# Patient Record
Sex: Female | Born: 1970 | Race: White | Hispanic: No | Marital: Married | Smoking: Never smoker
Health system: Southern US, Community
[De-identification: ages and names within clinical notes are randomized; demographics above are authoritative.]

## PROBLEM LIST (undated history)

## (undated) DIAGNOSIS — G43909 Migraine, unspecified, not intractable, without status migrainosus: Secondary | ICD-10-CM

## (undated) DIAGNOSIS — J45909 Unspecified asthma, uncomplicated: Secondary | ICD-10-CM

## (undated) DIAGNOSIS — N6001 Solitary cyst of right breast: Secondary | ICD-10-CM

## (undated) DIAGNOSIS — T7840XA Allergy, unspecified, initial encounter: Secondary | ICD-10-CM

## (undated) HISTORY — DX: Migraine, unspecified, not intractable, without status migrainosus: G43.909

## (undated) HISTORY — DX: Allergy, unspecified, initial encounter: T78.40XA

## (undated) HISTORY — DX: Unspecified asthma, uncomplicated: J45.909

## (undated) HISTORY — DX: Solitary cyst of right breast: N60.01

---

## 2014-05-31 ENCOUNTER — Ambulatory Visit (INDEPENDENT_AMBULATORY_CARE_PROVIDER_SITE_OTHER): Payer: BC Managed Care – PPO | Admitting: Family Medicine

## 2014-05-31 VITALS — BP 110/68 | HR 70 | Temp 97.6°F | Resp 18 | Ht 63.5 in | Wt 121.0 lb

## 2014-05-31 DIAGNOSIS — J3089 Other allergic rhinitis: Secondary | ICD-10-CM

## 2014-05-31 DIAGNOSIS — J302 Other seasonal allergic rhinitis: Secondary | ICD-10-CM

## 2014-05-31 DIAGNOSIS — J019 Acute sinusitis, unspecified: Secondary | ICD-10-CM

## 2014-05-31 DIAGNOSIS — J0181 Other acute recurrent sinusitis: Secondary | ICD-10-CM

## 2014-05-31 DIAGNOSIS — J45909 Unspecified asthma, uncomplicated: Secondary | ICD-10-CM

## 2014-05-31 MED ORDER — PREDNISONE 20 MG PO TABS: 40.0000 mg | ORAL_TABLET | Freq: Every day | ORAL | Status: DC

## 2014-05-31 MED ORDER — AMOXICILLIN-POT CLAVULANATE 875-125 MG PO TABS: 1.0000 | ORAL_TABLET | Freq: Two times a day (BID) | ORAL | Status: DC

## 2014-05-31 MED ORDER — FLUTICASONE-SALMETEROL 250-50 MCG/DOSE IN AEPB: 1.0000 | INHALATION_SPRAY | Freq: Two times a day (BID) | RESPIRATORY_TRACT | Status: AC

## 2014-05-31 NOTE — Patient Instructions (Signed)
Start prednisone, continue flonase, zyrtec. If pain/sinus symptoms not improving in next 2-3 days - can fill Augmentin. Advair prescribed, and I will refer you to allergist.  Return to the clinic or go to the nearest emergency room if any of your symptoms worsen or new symptoms occur.     Sinusitis Sinusitis is redness, soreness, and inflammation of the paranasal sinuses. Paranasal sinuses are air pockets within the bones of your face (beneath the eyes, the middle of the forehead, or above the eyes). In healthy paranasal sinuses, mucus is able to drain out, and air is able to circulate through them by way of your nose. However, when your paranasal sinuses are inflamed, mucus and air can become trapped. This can allow bacteria and other germs to grow and cause infection. Sinusitis can develop quickly and last only a short time (acute) or continue over a long period (chronic). Sinusitis that lasts for more than 12 weeks is considered chronic.  CAUSES  Causes of sinusitis include:  Allergies.  Structural abnormalities, such as displacement of the cartilage that separates your nostrils (deviated septum), which can decrease the air flow through your nose and sinuses and affect sinus drainage.  Functional abnormalities, such as when the small hairs (cilia) that line your sinuses and help remove mucus do not work properly or are not present. SIGNS AND SYMPTOMS  Symptoms of acute and chronic sinusitis are the same. The primary symptoms are pain and pressure around the affected sinuses. Other symptoms include:  Upper toothache.  Earache.  Headache.  Bad breath.  Decreased sense of smell and taste.  A cough, which worsens when you are lying flat.  Fatigue.  Fever.  Thick drainage from your nose, which often is green and may contain pus (purulent).  Swelling and warmth over the affected sinuses. DIAGNOSIS  Your health care provider will perform a physical exam. During the exam, your  health care provider may:  Look in your nose for signs of abnormal growths in your nostrils (nasal polyps).  Tap over the affected sinus to check for signs of infection.  View the inside of your sinuses (endoscopy) using an imaging device that has a light attached (endoscope). If your health care provider suspects that you have chronic sinusitis, one or more of the following tests may be recommended:  Allergy tests.  Nasal culture. A sample of mucus is taken from your nose, sent to a lab, and screened for bacteria.  Nasal cytology. A sample of mucus is taken from your nose and examined by your health care provider to determine if your sinusitis is related to an allergy. TREATMENT  Most cases of acute sinusitis are related to a viral infection and will resolve on their own within 10 days. Sometimes medicines are prescribed to help relieve symptoms (pain medicine, decongestants, nasal steroid sprays, or saline sprays).  However, for sinusitis related to a bacterial infection, your health care provider will prescribe antibiotic medicines. These are medicines that will help kill the bacteria causing the infection.  Rarely, sinusitis is caused by a fungal infection. In theses cases, your health care provider will prescribe antifungal medicine. For some cases of chronic sinusitis, surgery is needed. Generally, these are cases in which sinusitis recurs more than 3 times per year, despite other treatments. HOME CARE INSTRUCTIONS   Drink plenty of water. Water helps thin the mucus so your sinuses can drain more easily.  Use a humidifier.  Inhale steam 3 to 4 times a day (for example, sit in  the bathroom with the shower running).  Apply a warm, moist washcloth to your face 3 to 4 times a day, or as directed by your health care provider.  Use saline nasal sprays to help moisten and clean your sinuses.  Take medicines only as directed by your health care provider.  If you were prescribed either  an antibiotic or antifungal medicine, finish it all even if you start to feel better. SEEK IMMEDIATE MEDICAL CARE IF:  You have increasing pain or severe headaches.  You have nausea, vomiting, or drowsiness.  You have swelling around your face.  You have vision problems.  You have a stiff neck.  You have difficulty breathing. MAKE SURE YOU:   Understand these instructions.  Will watch your condition.  Will get help right away if you are not doing well or get worse. Document Released: 09/03/2005 Document Revised: 01/18/2014 Document Reviewed: 09/18/2011 [facility] Patient Information 2015 [place], [place]. This information is not intended to replace advice given to you by your health care provider. Make sure you discuss any questions you have with your health care provider. Allergic Rhinitis Allergic rhinitis is when the mucous membranes in the nose respond to allergens. Allergens are particles in the air that cause your body to have an allergic reaction. This causes you to release allergic antibodies. Through a chain of events, these eventually cause you to release histamine into the blood stream. Although meant to protect the body, it is this release of histamine that causes your discomfort, such as frequent sneezing, congestion, and an itchy, runny nose.  CAUSES  Seasonal allergic rhinitis (hay fever) is caused by pollen allergens that may come from grasses, trees, and weeds. Year-round allergic rhinitis (perennial allergic rhinitis) is caused by allergens such as house dust mites, pet dander, and mold spores.  SYMPTOMS   Nasal stuffiness (congestion).  Itchy, runny nose with sneezing and tearing of the eyes. DIAGNOSIS  Your health care provider can help you determine the allergen or allergens that trigger your symptoms. If you and your health care provider are unable to determine the allergen, skin or blood testing may be used. TREATMENT  Allergic rhinitis does not have a cure, but  it can be controlled by:  Medicines and allergy shots (immunotherapy).  Avoiding the allergen. Hay fever may often be treated with antihistamines in pill or nasal spray forms. Antihistamines block the effects of histamine. There are over-the-counter medicines that may help with nasal congestion and swelling around the eyes. Check with your health care provider before taking or giving this medicine.  If avoiding the allergen or the medicine prescribed do not work, there are many new medicines your health care provider can prescribe. Stronger medicine may be used if initial measures are ineffective. Desensitizing injections can be used if medicine and avoidance does not work. Desensitization is when a patient is given ongoing shots until the body becomes less sensitive to the allergen. Make sure you follow up with your health care provider if problems continue. HOME CARE INSTRUCTIONS It is not possible to completely avoid allergens, but you can reduce your symptoms by taking steps to limit your exposure to them. It helps to know exactly what you are allergic to so that you can avoid your specific triggers. SEEK MEDICAL CARE IF:   You have a fever.  You develop a cough that does not stop easily (persistent).  You have shortness of breath.  You start wheezing.  Symptoms interfere with normal daily activities. Document Released: 05/29/2001 Document  Revised: 09/08/2013 Document Reviewed: 05/11/2013 [facility] Patient Information 2015 [place], [place]. This information is not intended to replace advice given to you by your health care provider. Make sure you discuss any questions you have with your health care provider.

## 2014-05-31 NOTE — Progress Notes (Signed)
Subjective:    Patient ID: Jocelyn Hayes, female    DOB: 06-19-1971, 43 y.o.   MRN: [id number]  HPI Jocelyn Hayes is a 43 y.o. female  Moved from [place], [place]. 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   Hx of seasonal allergies - usually in August. Takes flonase most days, has been taking every day for past 3-4 days and Zyrtec, hx allergies and exercise induced asthma. Prior on Advair - higher dose during allergy season. Insurance substituted Symbicort - unknown dose, but with new insurance would like to restart Advair. Has albuterol - uses with running only - no recent flair of asthma.   Has had recent flair of allergies and hx of sinus infections with allergies. More pain in sinuses, pressure in face and head and new headache past few days. Previously treated with Zpak. No fever.  No saline NS.     There are no active problems to display for this patient.  Past Medical History  Diagnosis Date  . Allergy   . Asthma   . Migraine    Past Surgical History  Procedure Laterality Date  . Cesarean section     Allergies  Allergen Reactions  . Molds & Smuts   . Other     Cats, grass, oak trees   Prior to Admission medications   Medication Sig Start Date End Date Taking? Authorizing Provider  cetirizine (ZYRTEC) 10 MG tablet Take 10 mg by mouth daily.   Yes Historical Provider, MD  eletriptan (RELPAX) 40 MG tablet Take 40 mg by mouth as needed for migraine or headache. One tablet by mouth at onset of headache. May repeat in 2 hours if headache persists or recurs.   Yes Historical Provider, MD  fluticasone (FLONASE) 50 MCG/ACT nasal spray Place 2 sprays into both nostrils daily.   Yes Historical Provider, MD  Fluticasone-Salmeterol (ADVAIR HFA IN) Inhale into the lungs.   Yes Historical Provider, MD   History   Social History  . Marital Status: Married    Spouse Name: N/A    Number of Children: N/A  . Years of Education: N/A   Occupational History  .  Not on file.   Social History Main Topics  . Smoking status: Never Smoker   . Smokeless tobacco: Not on file  . Alcohol Use: Yes  . Drug Use: No  . Sexual Activity: Not on file   Other Topics Concern  . Not on file   Social History Narrative  . No narrative on file      Review of Systems  Constitutional: Negative for fever and chills.  HENT: Positive for congestion and sinus pressure.   Neurological: Positive for headaches.       Objective:   Physical Exam  Vitals reviewed. Constitutional: She is oriented to person, place, and time. She appears well-developed and well-nourished. No distress.  HENT:  Head: Normocephalic and atraumatic.  Right Ear: Hearing, tympanic membrane, external ear and ear canal normal.  Left Ear: Hearing, tympanic membrane, external ear and ear canal normal.  Nose: Nose normal. Right sinus exhibits no maxillary sinus tenderness and no frontal sinus tenderness. Left sinus exhibits no maxillary sinus tenderness and no frontal sinus tenderness.  Mouth/Throat: Oropharynx is clear and moist. No oropharyngeal exudate.  Eyes: Conjunctivae and EOM are normal. Pupils are equal, round, and reactive to light.  Cardiovascular: Normal rate, regular rhythm, normal heart sounds and intact distal pulses.   No murmur heard. Pulmonary/Chest: Effort normal and  breath sounds normal. No respiratory distress. She has no decreased breath sounds. She has no wheezes. She has no rhonchi. She has no rales.  Neurological: She is alert and oriented to person, place, and time.  Skin: Skin is warm and dry. No rash noted.  Psychiatric: She has a normal mood and affect. Her behavior is normal.    Filed Vitals:   05/31/14 1026  BP: 110/68  Pulse: 70  Temp: 97.6 F (36.4 C)  TempSrc: Oral  Resp: 18  Height: 5' 3.5" (1.613 m)  Weight: 121 lb (54.885 kg)  SpO2: 100%       Assessment & Plan:   Jocelyn Hayes is a 43 y.o. female Other seasonal allergic rhinitis - Plan:  predniSONE (DELTASONE) 20 MG tablet, Ambulatory referral to Allergy Other acute recurrent sinusitis - Plan: amoxicillin-clavulanate (AUGMENTIN) 875-125 MG per tablet, predniSONE (DELTASONE) 20 MG tablet, Ambulatory referral to Allergy - suspected allergic flair and congestion with early sinusitis.  No purulence, less likely infected, so can start with prednisone, saline NS then start Augmentin in few days if not improving. SED. rtc precautions.  - referred to allergist to establish care.   Extrinsic asthma, unspecified - Plan: Fluticasone-Salmeterol (ADVAIR DISKUS) 250-50 MCG/DOSE AEPB, Ambulatory referral to Allergy  -asx at present. Restarted Advair as tolerated this prior, and refer to allergist.   rtc precautions.   Meds ordered this encounter  Medications  . fluticasone (FLONASE) 50 MCG/ACT nasal spray    Sig: Place 2 sprays into both nostrils daily.  . cetirizine (ZYRTEC) 10 MG tablet    Sig: Take 10 mg by mouth daily.  Marland Kitchen eletriptan (RELPAX) 40 MG tablet    Sig: Take 40 mg by mouth as needed for migraine or headache. One tablet by mouth at onset of headache. May repeat in 2 hours if headache persists or recurs.  Marland Kitchen DISCONTD: Fluticasone-Salmeterol (ADVAIR HFA IN)    Sig: Inhale into the lungs.  . Fluticasone-Salmeterol (ADVAIR DISKUS) 250-50 MCG/DOSE AEPB    Sig: Inhale 1 puff into the lungs 2 (two) times daily.    Dispense:  1 each    Refill:  3  . amoxicillin-clavulanate (AUGMENTIN) 875-125 MG per tablet    Sig: Take 1 tablet by mouth 2 (two) times daily.    Dispense:  20 tablet    Refill:  0  . predniSONE (DELTASONE) 20 MG tablet    Sig: Take 2 tablets (40 mg total) by mouth daily with breakfast.    Dispense:  10 tablet    Refill:  0   Patient Instructions  Start prednisone, continue flonase, zyrtec. If pain/sinus symptoms not improving in next 2-3 days - can fill Augmentin. Advair prescribed, and I will refer you to allergist.  Return to the clinic or go to the nearest  emergency room if any of your symptoms worsen or new symptoms occur.     Sinusitis Sinusitis is redness, soreness, and inflammation of the paranasal sinuses. Paranasal sinuses are air pockets within the bones of your face (beneath the eyes, the middle of the forehead, or above the eyes). In healthy paranasal sinuses, mucus is able to drain out, and air is able to circulate through them by way of your nose. However, when your paranasal sinuses are inflamed, mucus and air can become trapped. This can allow bacteria and other germs to grow and cause infection. Sinusitis can develop quickly and last only a short time (acute) or continue over a long period (chronic). Sinusitis that lasts for more  than 12 weeks is considered chronic.  CAUSES  Causes of sinusitis include:  Allergies.  Structural abnormalities, such as displacement of the cartilage that separates your nostrils (deviated septum), which can decrease the air flow through your nose and sinuses and affect sinus drainage.  Functional abnormalities, such as when the small hairs (cilia) that line your sinuses and help remove mucus do not work properly or are not present. SIGNS AND SYMPTOMS  Symptoms of acute and chronic sinusitis are the same. The primary symptoms are pain and pressure around the affected sinuses. Other symptoms include:  Upper toothache.  Earache.  Headache.  Bad breath.  Decreased sense of smell and taste.  A cough, which worsens when you are lying flat.  Fatigue.  Fever.  Thick drainage from your nose, which often is green and may contain pus (purulent).  Swelling and warmth over the affected sinuses. DIAGNOSIS  Your health care provider will perform a physical exam. During the exam, your health care provider may:  Look in your nose for signs of abnormal growths in your nostrils (nasal polyps).  Tap over the affected sinus to check for signs of infection.  View the inside of your sinuses (endoscopy)  using an imaging device that has a light attached (endoscope). If your health care provider suspects that you have chronic sinusitis, one or more of the following tests may be recommended:  Allergy tests.  Nasal culture. A sample of mucus is taken from your nose, sent to a lab, and screened for bacteria.  Nasal cytology. A sample of mucus is taken from your nose and examined by your health care provider to determine if your sinusitis is related to an allergy. TREATMENT  Most cases of acute sinusitis are related to a viral infection and will resolve on their own within 10 days. Sometimes medicines are prescribed to help relieve symptoms (pain medicine, decongestants, nasal steroid sprays, or saline sprays).  However, for sinusitis related to a bacterial infection, your health care provider will prescribe antibiotic medicines. These are medicines that will help kill the bacteria causing the infection.  Rarely, sinusitis is caused by a fungal infection. In theses cases, your health care provider will prescribe antifungal medicine. For some cases of chronic sinusitis, surgery is needed. Generally, these are cases in which sinusitis recurs more than 3 times per year, despite other treatments. HOME CARE INSTRUCTIONS   Drink plenty of water. Water helps thin the mucus so your sinuses can drain more easily.  Use a humidifier.  Inhale steam 3 to 4 times a day (for example, sit in the bathroom with the shower running).  Apply a warm, moist washcloth to your face 3 to 4 times a day, or as directed by your health care provider.  Use saline nasal sprays to help moisten and clean your sinuses.  Take medicines only as directed by your health care provider.  If you were prescribed either an antibiotic or antifungal medicine, finish it all even if you start to feel better. SEEK IMMEDIATE MEDICAL CARE IF:  You have increasing pain or severe headaches.  You have nausea, vomiting, or drowsiness.  You  have swelling around your face.  You have vision problems.  You have a stiff neck.  You have difficulty breathing. MAKE SURE YOU:   Understand these instructions.  Will watch your condition.  Will get help right away if you are not doing well or get worse. Document Released: 09/03/2005 Document Revised: 01/18/2014 Document Reviewed: 09/18/2011 [employer] Patient Information 2015  [employer]. This information is not intended to replace advice given to you by your health care provider. Make sure you discuss any questions you have with your health care provider. Allergic Rhinitis Allergic rhinitis is when the mucous membranes in the nose respond to allergens. Allergens are particles in the air that cause your body to have an allergic reaction. This causes you to release allergic antibodies. Through a chain of events, these eventually cause you to release histamine into the blood stream. Although meant to protect the body, it is this release of histamine that causes your discomfort, such as frequent sneezing, congestion, and an itchy, runny nose.  CAUSES  Seasonal allergic rhinitis (hay fever) is caused by pollen allergens that may come from grasses, trees, and weeds. Year-round allergic rhinitis (perennial allergic rhinitis) is caused by allergens such as house dust mites, pet dander, and mold spores.  SYMPTOMS   Nasal stuffiness (congestion).  Itchy, runny nose with sneezing and tearing of the eyes. DIAGNOSIS  Your health care provider can help you determine the allergen or allergens that trigger your symptoms. If you and your health care provider are unable to determine the allergen, skin or blood testing may be used. TREATMENT  Allergic rhinitis does not have a cure, but it can be controlled by:  Medicines and allergy shots (immunotherapy).  Avoiding the allergen. Hay fever may often be treated with antihistamines in pill or nasal spray forms. Antihistamines block the effects of  histamine. There are over-the-counter medicines that may help with nasal congestion and swelling around the eyes. Check with your health care provider before taking or giving this medicine.  If avoiding the allergen or the medicine prescribed do not work, there are many new medicines your health care provider can prescribe. Stronger medicine may be used if initial measures are ineffective. Desensitizing injections can be used if medicine and avoidance does not work. Desensitization is when a patient is given ongoing shots until the body becomes less sensitive to the allergen. Make sure you follow up with your health care provider if problems continue. HOME CARE INSTRUCTIONS It is not possible to completely avoid allergens, but you can reduce your symptoms by taking steps to limit your exposure to them. It helps to know exactly what you are allergic to so that you can avoid your specific triggers. SEEK MEDICAL CARE IF:   You have a fever.  You develop a cough that does not stop easily (persistent).  You have shortness of breath.  You start wheezing.  Symptoms interfere with normal daily activities. Document Released: 05/29/2001 Document Revised: 09/08/2013 Document Reviewed: 05/11/2013 [facility] Patient Information 2015 [place], [place]. This information is not intended to replace advice given to you by your health care provider. Make sure you discuss any questions you have with your health care provider.

## 2014-06-11 ENCOUNTER — Ambulatory Visit (INDEPENDENT_AMBULATORY_CARE_PROVIDER_SITE_OTHER): Payer: BC Managed Care – PPO | Admitting: Emergency Medicine

## 2014-06-11 VITALS — BP 110/80 | HR 92 | Temp 97.9°F | Resp 16 | Ht 64.0 in | Wt 120.0 lb

## 2014-06-11 DIAGNOSIS — N63 Unspecified lump in unspecified breast: Secondary | ICD-10-CM

## 2014-06-11 MED ORDER — ELETRIPTAN HYDROBROMIDE 40 MG PO TABS: 40.0000 mg | ORAL_TABLET | ORAL | Status: DC | PRN

## 2014-06-11 NOTE — Progress Notes (Signed)
[facility] [address] [place] [address] [phone]  Date:  06/11/2014   Name:  Jocelyn Hayes   DOB:  [birth year]   MRN:  [id number]  PCP:  No PCP Per Patient    Chief Complaint: Referral   History of Present Illness:  Jocelyn Hayes is a 43 y.o. very pleasant female patient who presents with the following:  History of several abnormal mammograms and is on a six month cycle for follow up.  She recently moved to the area and needs follow up. Needs refills on relpax No improvement with over the counter medications or other home remedies.  Denies other complaint or health concern today.    There are no active problems to display for this patient.   Past Medical History  Diagnosis Date  . Allergy   . Asthma   . Migraine     Past Surgical History  Procedure Laterality Date  . Cesarean section      History  Substance Use Topics  . Smoking status: Never Smoker   . Smokeless tobacco: Not on file  . Alcohol Use: Yes    Family History  Problem Relation Age of Onset  . Cancer Maternal Grandfather   . Cancer Paternal Grandfather     Allergies  Allergen Reactions  . Molds & Smuts   . Other     Cats, grass, oak trees    Medication list has been reviewed and updated.  Current Outpatient Prescriptions on File Prior to Visit  Medication Sig Dispense Refill  . cetirizine (ZYRTEC) 10 MG tablet Take 10 mg by mouth daily.      Marland Kitchen eletriptan (RELPAX) 40 MG tablet Take 40 mg by mouth as needed for migraine or headache. One tablet by mouth at onset of headache. May repeat in 2 hours if headache persists or recurs.      . fluticasone (FLONASE) 50 MCG/ACT nasal spray Place 2 sprays into both nostrils daily.      . Fluticasone-Salmeterol (ADVAIR DISKUS) 250-50 MCG/DOSE AEPB Inhale 1 puff into the lungs 2 (two) times daily.  1 each  3   No current facility-administered medications on file prior to visit.    Review of Systems: As per HPI,  otherwise negative.   Physical Examination: Filed Vitals:   06/11/14 0847  BP: 110/80  Pulse: 92  Temp: 97.9 F (36.6 C)  Resp: 16   Filed Vitals:   06/11/14 0847  Height:  (1.626 m)  Weight: 120 lb (54.432 kg)   Body mass index is 20.59 kg/(m^2). Ideal Body Weight: Weight in (lb) to have BMI = 25: 145.3   GEN: WDWN, NAD, Non-toxic, Alert & Oriented x 3 HEENT: Atraumatic, Normocephalic.  Ears and Nose: No external deformity. EXTR: No clubbing/cyanosis/edema NEURO: Normal gait.  PSYCH: Normally interactive. Conversant. Not depressed or anxious appearing.  Calm demeanor.    Assessment and Plan: screening mammogram  Signed,  Phillips Odor, MD

## 2014-06-11 NOTE — Patient Instructions (Signed)

## 2014-06-14 ENCOUNTER — Other Ambulatory Visit: Payer: Self-pay | Admitting: Radiology

## 2014-06-14 DIAGNOSIS — N6310 Unspecified lump in the right breast, unspecified quadrant: Secondary | ICD-10-CM

## 2014-06-29 NOTE — Progress Notes (Signed)
Spoke with patient regarding scheduling f/u appt for breast mass here [place]. She stated that she did not have mammogram because no one had called her. I spoke with Lupita LeashDonna in referral, she had left msg for the pt to take previous [facility] and [facility] before [facility] would schedule an appt. Pt was advised and she stated that she has images and reports, she will give them a call.

## 2014-07-13 ENCOUNTER — Telehealth: Payer: Self-pay | Admitting: Family Medicine

## 2014-07-13 NOTE — Telephone Encounter (Signed)
Message copied by Alison MurrayHOLLETT, Merrianne Mccumbers L on Tue Jul 13, 2014  1:17 PM ------      Message from: [place], [place] S      Created: Fri Jun 11, 2014  9:19 AM       Schedule 104 not Chelle please ------

## 2014-07-14 ENCOUNTER — Encounter (INDEPENDENT_AMBULATORY_CARE_PROVIDER_SITE_OTHER): Payer: Self-pay

## 2014-07-14 ENCOUNTER — Ambulatory Visit
Admission: RE | Admit: 2014-07-14 | Discharge: 2014-07-14 | Disposition: A | Discharge status: Home / Self Care | Payer: BC Managed Care – PPO | Source: Ambulatory Visit | Attending: Emergency Medicine | Admitting: Emergency Medicine

## 2014-07-14 DIAGNOSIS — N6310 Unspecified lump in the right breast, unspecified quadrant: Secondary | ICD-10-CM

## 2014-07-14 DIAGNOSIS — N63 Unspecified lump in unspecified breast: Secondary | ICD-10-CM

## 2014-07-22 NOTE — Telephone Encounter (Signed)
-----   Message from Carmelina DaneJeffery S Anderson, MD sent at 06/11/2014  9:19 AM EDT ----- Schedule 104 not Chelle please

## 2014-07-22 NOTE — Telephone Encounter (Signed)
LMOM to CB to make appointment.

## 2014-07-29 ENCOUNTER — Encounter: Payer: Self-pay | Admitting: Obstetrics and Gynecology

## 2014-07-29 ENCOUNTER — Ambulatory Visit (INDEPENDENT_AMBULATORY_CARE_PROVIDER_SITE_OTHER): Payer: BC Managed Care – PPO | Admitting: Obstetrics and Gynecology

## 2014-07-29 VITALS — BP 116/70 | HR 72 | Ht 63.75 in | Wt 126.0 lb

## 2014-07-29 DIAGNOSIS — Z01419 Encounter for gynecological examination (general) (routine) without abnormal findings: Secondary | ICD-10-CM

## 2014-07-29 DIAGNOSIS — Z0000 Encounter for general adult medical examination without abnormal findings: Secondary | ICD-10-CM

## 2014-07-29 LAB — POCT URINALYSIS DIPSTICK
BILIRUBIN UA: NEGATIVE
Blood, UA: NEGATIVE
GLUCOSE UA: NEGATIVE
KETONES UA: NEGATIVE
Leukocytes, UA: NEGATIVE
NITRITE UA: NEGATIVE
PH UA: 5
Protein, UA: NEGATIVE
Urobilinogen, UA: NEGATIVE

## 2014-07-29 NOTE — Patient Instructions (Signed)

## 2014-07-29 NOTE — Progress Notes (Signed)
43 y.o. G3P1010 MarriedCaucasianF here for annual exam.    Menses almost 70 days ago.  No hot flashes.  Other cycle was 28 days ago.  One other late menses.  Intercourse not painful but feels different.   [place]  Works at [employer].  Is the administrator.   Patient's last menstrual period was 07/27/2014.          Sexually active: Yes.    The current method of family planning is vasectomy.    Exercising: Yes.    Home exercise routine includes yoga. Smoker:  no  Health Maintenance: Pap:  One year ago.  History of abnormal Pap:  no MMG:  07/14/14 Bi-Rads 2 - Benign, milk calcium left breast, extremely dense breast tissue. Had an abnormal mammogram one year ago.  Colonoscopy:  never BMD:   never TDaP:  Unsure.  ?2008 Screening Labs: today, Hb today: 12.7, Urine today: neg, ph: 5.0   reports that she has never smoked. She does not have any smokeless tobacco history on file. She reports that she drinks alcohol. She reports that she does not use illicit drugs.  Past Medical History  Diagnosis Date  . Allergy   . Asthma   . Migraine     Past Surgical History  Procedure Laterality Date  . Cesarean section      Current Outpatient Prescriptions  Medication Sig Dispense Refill  . cetirizine (ZYRTEC) 10 MG tablet Take 10 mg by mouth daily.    Marland Kitchen. eletriptan (RELPAX) 40 MG tablet Take 1 tablet (40 mg total) by mouth as needed for migraine or headache. One tablet by mouth at onset of headache. May repeat in 2 hours if headache persists or recurs. 10 tablet 12  . fluticasone (FLONASE) 50 MCG/ACT nasal spray Place 2 sprays into both nostrils daily.    . Fluticasone-Salmeterol (ADVAIR DISKUS) 250-50 MCG/DOSE AEPB Inhale 1 puff into the lungs 2 (two) times daily. 1 each 3   No current facility-administered medications for this visit.    Family History  Problem Relation Age of Onset  . Cancer Maternal Grandfather   . Cancer Paternal Grandfather      ROS:  Pertinent items are noted in HPI.  Otherwise, a comprehensive ROS was negative.  Exam:   BP 116/70 mmHg  Pulse 72  Ht 5' 3.75" (1.619 m)  Wt 126 lb (57.153 kg)  BMI 21.80 kg/m2  LMP 07/27/2014      Height: 5' 3.75" (161.9 cm)  Ht Readings from Last 3 Encounters:  07/29/14 5' 3.75" (1.619 m)  06/11/14 5\' 4"  (1.626 m)  05/31/14 5' 3.5" (1.613 m)    General appearance: alert, cooperative and appears stated age Head: Normocephalic, without obvious abnormality, atraumatic Neck: no adenopathy, supple, symmetrical, trachea midline and thyroid normal to inspection and palpation Lungs: clear to auscultation bilaterally Breasts: normal appearance, no masses or tenderness, Inspection negative, No nipple retraction or dimpling, No nipple discharge or bleeding, No axillary or supraclavicular adenopathy, has sensitivity with breast exam. Heart: regular rate and rhythm Abdomen: soft, non-tender; bowel sounds normal; no masses,  no organomegaly Extremities: extremities normal, atraumatic, no cyanosis or edema Skin: Skin color, texture, turgor normal. No rashes or lesions Lymph nodes: Cervical, supraclavicular, and axillary nodes normal. No abnormal inguinal nodes palpated Neurologic: Grossly normal   Pelvic: External genitalia:  no lesions              Urethra:  normal appearing urethra with no masses, tenderness or lesions  Bartholins and Skenes: normal                 Vagina: normal appearing vagina with normal color and discharge, no lesions              Cervix: no lesions and  Dark blood from the os.  Some atrophy noted with speculum exam.              Pap taken: Yes.   Bimanual Exam:  Uterus:  normal size, contour, position, consistency, mobility, non-tender              Adnexa: normal adnexa and no mass, fullness, tenderness               Rectovaginal: Confirms               Anus:  normal sphincter tone, no lesions  A:  Well Woman with normal exam Dense breast  tissue.  Atrophic vaginal changes.    P:   Mammogram yearly.  Do 3D. pap smear and HR HPV testing.  Routine labs.  Discussed vit E for vagina, water based lubricants, cooking oils, and estrogen therapy. Will start with nonprescription options.  return annually or prn  An After Visit Summary was printed and given to the patient.

## 2014-07-30 LAB — COMPREHENSIVE METABOLIC PANEL
ALT: 15 U/L (ref 0–35)
AST: 32 U/L (ref 0–37)
Albumin: 4.2 g/dL (ref 3.5–5.2)
Alkaline Phosphatase: 73 U/L (ref 39–117)
BILIRUBIN TOTAL: 0.3 mg/dL (ref 0.2–1.2)
BUN: 10 mg/dL (ref 6–23)
CALCIUM: 9.1 mg/dL (ref 8.4–10.5)
CHLORIDE: 104 meq/L (ref 96–112)
CO2: 32 mEq/L (ref 19–32)
CREATININE: 0.82 mg/dL (ref 0.50–1.10)
Glucose, Bld: 79 mg/dL (ref 70–99)
Potassium: 4.9 mEq/L (ref 3.5–5.3)
Sodium: 140 mEq/L (ref 135–145)
Total Protein: 7.4 g/dL (ref 6.0–8.3)

## 2014-07-30 LAB — CBC
HEMATOCRIT: 40 % (ref 36.0–46.0)
HEMOGLOBIN: 12.9 g/dL (ref 12.0–15.0)
MCH: 31.2 pg (ref 26.0–34.0)
MCHC: 32.3 g/dL (ref 30.0–36.0)
MCV: 96.9 fL (ref 78.0–100.0)
Platelets: 266 10*3/uL (ref 150–400)
RBC: 4.13 MIL/uL (ref 3.87–5.11)
RDW: 13 % (ref 11.5–15.5)
WBC: 6.1 10*3/uL (ref 4.0–10.5)

## 2014-07-30 LAB — HEMOGLOBIN, FINGERSTICK: Hemoglobin, fingerstick: 12.7 g/dL (ref 12.0–16.0)

## 2014-07-30 LAB — LIPID PANEL
CHOLESTEROL: 153 mg/dL (ref 0–200)
HDL: 55 mg/dL (ref >39)
LDL CALC: 80 mg/dL (ref 0–99)
TRIGLYCERIDES: 92 mg/dL (ref <150)
Total CHOL/HDL Ratio: 2.8 Ratio
VLDL: 18 mg/dL (ref 0–40)

## 2014-08-02 LAB — IPS PAP TEST WITH HPV

## 2014-08-03 NOTE — Progress Notes (Signed)
Tried to call patient to schedule a 3 mth follow up but patient's phone number was disconnected.

## 2014-08-05 ENCOUNTER — Telehealth: Payer: Self-pay

## 2014-08-05 NOTE — Telephone Encounter (Signed)
-----   Message from WhitetailBrook E Amundson de Gwenevere Ghaziarvalho E Silva, MD sent at 08/02/2014  8:38 PM EST ----- Please inform the patient that she will need a repeat pap test with me in about 6 weeks, or when she is not on her menstrual cycle at all.  She had some menstrual flow at the time of her pap, and the lab said that it was unsatisfactory for evaluation.  Too many red cells and too few squamous cells.  Her HR HPV testing, however, was processed.  It was negative.  Recall - 6 weeks please.

## 2014-08-05 NOTE — Telephone Encounter (Signed)
Returning call.

## 2014-08-05 NOTE — Progress Notes (Signed)
Spoke to patient.  She declined to make an appointment at this time.

## 2014-08-05 NOTE — Telephone Encounter (Signed)
Called patient at [phone] to discuss pap smear results, LMOVM to call me back.

## 2014-08-06 NOTE — Telephone Encounter (Signed)
Patient notified see result note 

## 2014-08-19 ENCOUNTER — Ambulatory Visit (INDEPENDENT_AMBULATORY_CARE_PROVIDER_SITE_OTHER): Payer: BC Managed Care – PPO | Admitting: Obstetrics and Gynecology

## 2014-08-19 ENCOUNTER — Encounter: Payer: Self-pay | Admitting: Obstetrics and Gynecology

## 2014-08-19 VITALS — BP 98/68 | HR 78 | Resp 14 | Ht 63.75 in | Wt 123.6 lb

## 2014-08-19 DIAGNOSIS — R87615 Unsatisfactory cytologic smear of cervix: Secondary | ICD-10-CM

## 2014-08-19 NOTE — Progress Notes (Signed)
Patient ID: Jocelyn Hayes, female   DOB: 07/12/1971, 43 y.o.   MRN: [id number] GYNECOLOGY  VISIT   HPI: 43 y.o.   Married  Caucasian  female   [id number] with Patient's last menstrual period was 07/27/2014.   here for re-pap due to unsatisfactory pap on [facility] 07-29-14. Menses started again and patient is currently bleeding.   History of migraine headaches and use of Relpax. "It is a miracle drug." Has used it for 10 years. Is not certain if her new insurance will cover the cost, as it has required preauthorization in the past. Currently does not need a new Rx.  Has a new insurance company in [place].   GYNECOLOGIC HISTORY: Patient's last menstrual period was 07/27/2014. Contraception:   vasectomy Menopausal hormone therapy: n/a       OB History    Gravida Para Term Preterm AB TAB SAB Ectopic Multiple Living   3 1 1  1  1             There are no active problems to display for this patient.   Past Medical History  Diagnosis Date  . Allergy   . Asthma   . Migraine     Past Surgical History  Procedure Laterality Date  . Cesarean section      Current Outpatient Prescriptions  Medication Sig Dispense Refill  . cetirizine (ZYRTEC) 10 MG tablet Take 10 mg by mouth daily.    Marland Kitchen. eletriptan (RELPAX) 40 MG tablet Take 1 tablet (40 mg total) by mouth as needed for migraine or headache. One tablet by mouth at onset of headache. May repeat in 2 hours if headache persists or recurs. 10 tablet 12  . fluticasone (FLONASE) 50 MCG/ACT nasal spray Place 2 sprays into both nostrils daily.    . Fluticasone-Salmeterol (ADVAIR DISKUS) 250-50 MCG/DOSE AEPB Inhale 1 puff into the lungs 2 (two) times daily. 1 each 3   No current facility-administered medications for this visit.     ALLERGIES: Molds & smuts and Other  Family History  Problem Relation Age of Onset  . Cancer Maternal Grandfather   . Cancer Paternal Grandfather     History   Social History  . Marital Status: Married   Spouse Name: N/A    Number of Children: N/A  . Years of Education: N/A   Occupational History  . Not on file.   Social History Main Topics  . Smoking status: Never Smoker   . Smokeless tobacco: Not on file  . Alcohol Use: Yes     Comment: socially  . Drug Use: No  . Sexual Activity: Yes    Birth Control/ Protection: Other-see comments     Comment: Vasectomy   Other Topics Concern  . Not on file   Social History Narrative    ROS:  Pertinent items are noted in HPI.  PHYSICAL EXAMINATION:    Ht 5' 3.75" (1.619 m)  LMP 07/27/2014     General appearance: alert, cooperative and appears stated age    Pelvic: External genitalia:  no lesions              Urethra:  normal appearing urethra with no masses, tenderness or lesions              Bartholins and Skenes: normal                 Vagina: normal appearing vagina with normal color and discharge, no lesions, menstrual flow noted.  Cervix: normal appearance           ASSESSMENT  Unsatisfactory pap.  Menstrual flow today so unable to do pap. Migraine Headaches.   PLAN  Return next week for a pap smear.  Will refill Relpax when needed.   An After Visit Summary was printed and given to the patient.  __10____ minutes face to face time of which over 50% was spent in counseling.

## 2014-08-23 ENCOUNTER — Ambulatory Visit: Payer: BC Managed Care – PPO | Admitting: Obstetrics and Gynecology

## 2014-08-23 ENCOUNTER — Ambulatory Visit (INDEPENDENT_AMBULATORY_CARE_PROVIDER_SITE_OTHER): Payer: BC Managed Care – PPO | Admitting: Obstetrics and Gynecology

## 2014-08-23 ENCOUNTER — Encounter: Payer: Self-pay | Admitting: Obstetrics and Gynecology

## 2014-08-23 VITALS — BP 100/62 | HR 76 | Ht 63.75 in | Wt 124.0 lb

## 2014-08-23 DIAGNOSIS — R87615 Unsatisfactory cytologic smear of cervix: Secondary | ICD-10-CM

## 2014-08-23 DIAGNOSIS — Z124 Encounter for screening for malignant neoplasm of cervix: Secondary | ICD-10-CM

## 2014-08-23 NOTE — Progress Notes (Signed)
Patient ID: Jocelyn Hayes, female   DOB: 01/02/1971, 43 y.o.   MRN: [id number] GYNECOLOGY  VISIT   HPI: 43 y.o.   Married  Caucasian  female   [id number] with Patient's last menstrual period was 08/16/2014.   here for Re-pap.  Pap was unsatisfactory at [facility] on 07-29-14 due to menstrual cycle.  HR HPV was negative.   GYNECOLOGIC HISTORY: Patient's last menstrual period was 08/16/2014. Contraception:  vasectomy  Menopausal hormone therapy: n/a       OB History    Gravida Para Term Preterm AB TAB SAB Ectopic Multiple Living   3 1 1  1  1             There are no active problems to display for this patient.   Past Medical History  Diagnosis Date  . Allergy   . Asthma   . Migraine     Past Surgical History  Procedure Laterality Date  . Cesarean section      Current Outpatient Prescriptions  Medication Sig Dispense Refill  . cetirizine (ZYRTEC) 10 MG tablet Take 10 mg by mouth daily.    Marland Kitchen. eletriptan (RELPAX) 40 MG tablet Take 1 tablet (40 mg total) by mouth as needed for migraine or headache. One tablet by mouth at onset of headache. May repeat in 2 hours if headache persists or recurs. 10 tablet 12  . fluticasone (FLONASE) 50 MCG/ACT nasal spray Place 2 sprays into both nostrils daily.    . Fluticasone-Salmeterol (ADVAIR DISKUS) 250-50 MCG/DOSE AEPB Inhale 1 puff into the lungs 2 (two) times daily. 1 each 3   No current facility-administered medications for this visit.     ALLERGIES: Molds & smuts and Other  Family History  Problem Relation Age of Onset  . Cancer Maternal Grandfather   . Cancer Paternal Grandfather     History   Social History  . Marital Status: Married    Spouse Name: N/A    Number of Children: N/A  . Years of Education: N/A   Occupational History  . Not on file.   Social History Main Topics  . Smoking status: Never Smoker   . Smokeless tobacco: Not on file  . Alcohol Use: 0.0 oz/week    0 Not specified per week     Comment: socially  . Drug  Use: No  . Sexual Activity: Yes    Birth Control/ Protection: Other-see comments     Comment: Vasectomy   Other Topics Concern  . Not on file   Social History Narrative    ROS:  Pertinent items are noted in HPI.  PHYSICAL EXAMINATION:    BP 100/62 mmHg  Pulse 76  Ht 5' 3.75" (1.619 m)  Wt 124 lb (56.246 kg)  BMI 21.46 kg/m2  LMP 08/16/2014     General appearance: alert, cooperative and appears stated age Lungs: clear to auscultation bilaterally Heart: regular rate and rhythm Abdomen: soft, non-tender; no masses,  no organomegaly No abnormal inguinal nodes palpated  Pelvic: External genitalia:  no lesions              Urethra:  normal appearing urethra with no masses, tenderness or lesions              Bartholins and Skenes: normal                 Vagina: normal appearing vagina with normal color and discharge, no lesions              Cervix:  normal appearance.  No menstrual bleeding.          ASSESSMENT  Insufficient cells on pap due to menstrual blood.    PLAN  Pap only performed today.  Return prn   An After Visit Summary was printed and given to the patient.  ___5___ minutes face to face time of which over 50% was spent in counseling.

## 2014-08-23 NOTE — Patient Instructions (Signed)
We will contact you with pap results!

## 2014-08-24 LAB — IPS PAP SMEAR ONLY

## 2014-09-17 DIAGNOSIS — N6001 Solitary cyst of right breast: Secondary | ICD-10-CM

## 2014-09-17 HISTORY — DX: Solitary cyst of right breast: N60.01

## 2014-11-15 ENCOUNTER — Telehealth: Payer: Self-pay | Admitting: Physician Assistant

## 2014-11-15 ENCOUNTER — Ambulatory Visit (INDEPENDENT_AMBULATORY_CARE_PROVIDER_SITE_OTHER): Payer: BLUE CROSS/BLUE SHIELD | Admitting: Internal Medicine

## 2014-11-15 VITALS — BP 120/60 | HR 68 | Temp 97.3°F | Resp 16 | Ht 63.75 in | Wt 125.0 lb

## 2014-11-15 DIAGNOSIS — G43109 Migraine with aura, not intractable, without status migrainosus: Secondary | ICD-10-CM

## 2014-11-15 DIAGNOSIS — G43909 Migraine, unspecified, not intractable, without status migrainosus: Secondary | ICD-10-CM | POA: Insufficient documentation

## 2014-11-15 DIAGNOSIS — Z7189 Other specified counseling: Secondary | ICD-10-CM

## 2014-11-15 NOTE — Patient Instructions (Signed)
Eletriptan tablets What is this medicine? ELETRIPTAN (el ih TRIP tan) is used to treat migraines with or without aura. An aura is a strange feeling or visual disturbance that warns you of an attack. It is not used to prevent migraines. This medicine may be used for other purposes; ask your health care provider or pharmacist if you have questions. COMMON BRAND NAME(S): Relpax What should I tell my health care provider before I take this medicine? They need to know if you have any of these conditions: -bowel disease or colitis -diabetes -family history of heart disease -fast or irregular heart beat -heart or blood vessel disease, angina (chest pain), or previous heart attack -high blood pressure -high cholesterol -history of stroke, transient ischemic attacks (TIAs or mini-strokes), or intracranial bleeding -kidney or liver disease -overweight -poor circulation -postmenopausal or surgical removal of uterus and ovaries -Raynaud's disease -seizure disorder -an unusual or allergic reaction to eletriptan, other medicines, foods, dyes, or preservatives -pregnant or trying to get pregnant -breast-feeding How should I use this medicine? Take this medicine by mouth with a glass of water. Follow the directions on the prescription label. This medicine is taken at the first symptoms of a migraine. It is not for everyday use. If your migraine headache returns after one dose, you can take another dose as directed. You must leave at least 2 hours between doses, and do not take more than 40 mg as a single dose. Do not take more than 80 mg total in any 24 hour period. If there is no improvement at all after the first dose, do not take a second dose without talking to your doctor or health care professional. Do not take your medicine more often than directed. Talk to your pediatrician regarding the use of this medicine in children. Special care may be needed. Overdosage: If you think you have taken too much  of this medicine contact a poison control center or emergency room at once. NOTE: This medicine is only for you. Do not share this medicine with others. What if I miss a dose? This does not apply; this medicine is not for regular use. What may interact with this medicine? Do not take this medicine with any of the following medications: -amiodarone -amphetamine, dextroamphetamine, or cocaine -aprepitant -certain antibiotics like clarithromycin, erythromycin, troleandomycin -cimetidine -conivaptan -dalfopristin; quinupristin -dihydroergotamine, ergotamine, ergoloid mesylates, methysergide, or ergot-type medication - do not take within 24 hours of taking eletriptan. -diltiazem -feverfew -imatinib -medicines for fungal infections like fluconazole, itraconazole, ketoconazole, and voriconazole -medicines for HIV, AIDS -medicines for mental depression like fluvoxamine and nefazodone -mifepristone -other migraine medicines like almotriptan, sumatriptan, naratriptan, rizatriptan, zolmitriptan - do not take within 24 hours of taking eletriptan. -tryptophan -verapamil This medicine may also interact with the following medications: -medicines for mental depression, anxiety or mood problems This list may not describe all possible interactions. Give your health care provider a list of all the medicines, herbs, non-prescription drugs, or dietary supplements you use. Also tell them if you smoke, drink alcohol, or use illegal drugs. Some items may interact with your medicine. What should I watch for while using this medicine? Only take this medicine for a migraine headache. Take it if you get warning symptoms or at the start of a migraine attack. It is not for regular use to prevent migraine attacks. You may get drowsy or dizzy. Do not drive, use machinery, or do anything that needs mental alertness until you know how this medicine affects you. To reduce dizzy or  fainting spells, do not sit or stand up  quickly, especially if you are an older patient. Alcohol can increase drowsiness, dizziness and flushing. Avoid alcoholic drinks. Smoking cigarettes may increase the risk of heart-related side effects from using this medicine. If you take migraine medicines for 10 or more days a month, your migraines may get worse. Keep a diary of headache days and medicine use. Contact your healthcare professional if your migraine attacks occur more frequently. What side effects may I notice from receiving this medicine? Side effects that you should report to your doctor or health care professional as soon as possible: -allergic reactions like skin rash, itching or hives, swelling of the face, lips, or tongue -fast, slow, or irregular heart beat -increased or decreased blood pressure -seizures -severe stomach pain and cramping, bloody diarrhea -signs and symptoms of a blood clot such as breathing problems; changes in vision; chest pain; severe, sudden headache; pain, swelling, warmth in the leg; trouble speaking; sudden numbness or weakness of the face, arm or leg -tingling, pain, or numbness in the face, hands, or feet Side effects that usually do not require medical attention (report to your doctor or health care professional if they continue or are bothersome): -drowsiness -feeling warm, flushing, or redness of the face -headache -muscle cramps, pain -nausea, vomiting -unusually weak or tired This list may not describe all possible side effects. Call your doctor for medical advice about side effects. You may report side effects to FDA at 1-800-FDA-1088. Where should I keep my medicine? Keep out of the reach of children. Store at room temperature between 15 and 30 degrees C (59 and 86 degrees F). Throw away any unused medicine after the expiration date. NOTE: This sheet is a summary. It may not cover all possible information. If you have questions about this medicine, talk to your doctor, pharmacist, or  health care provider.  2015, Elsevier/Gold Standard. (2013-05-05 10:22:32) Migraine Headache A migraine headache is an intense, throbbing pain on one or both sides of your head. A migraine can last for 30 minutes to several hours. CAUSES  The exact cause of a migraine headache is not always known. However, a migraine may be caused when nerves in the brain become irritated and release chemicals that cause inflammation. This causes pain. Certain things may also trigger migraines, such as:  Alcohol.  Smoking.  Stress.  Menstruation.  Aged cheeses.  Foods or drinks that contain nitrates, glutamate, aspartame, or tyramine.  Lack of sleep.  Chocolate.  Caffeine.  Hunger.  Physical exertion.  Fatigue.  Medicines used to treat chest pain (nitroglycerine), birth control pills, estrogen, and some blood pressure medicines. SIGNS AND SYMPTOMS  Pain on one or both sides of your head.  Pulsating or throbbing pain.  Severe pain that prevents daily activities.  Pain that is aggravated by any physical activity.  Nausea, vomiting, or both.  Dizziness.  Pain with exposure to bright lights, loud noises, or activity.  General sensitivity to bright lights, loud noises, or smells. Before you get a migraine, you may get warning signs that a migraine is coming (aura). An aura may include:  Seeing flashing lights.  Seeing bright spots, halos, or zigzag lines.  Having tunnel vision or blurred vision.  Having feelings of numbness or tingling.  Having trouble talking.  Having muscle weakness. DIAGNOSIS  A migraine headache is often diagnosed based on:  Symptoms.  Physical exam.  A CT scan or MRI of your head. These imaging tests cannot diagnose migraines, but  they can help rule out other causes of headaches. TREATMENT Medicines may be given for pain and nausea. Medicines can also be given to help prevent recurrent migraines.  HOME CARE INSTRUCTIONS  Only take  over-the-counter or prescription medicines for pain or discomfort as directed by your health care provider. The use of long-term narcotics is not recommended.  Lie down in a dark, quiet room when you have a migraine.  Keep a journal to find out what may trigger your migraine headaches. For example, write down:  What you eat and drink.  How much sleep you get.  Any change to your diet or medicines.  Limit alcohol consumption.  Quit smoking if you smoke.  Get 7-9 hours of sleep, or as recommended by your health care provider.  Limit stress.  Keep lights dim if bright lights bother you and make your migraines worse. SEEK IMMEDIATE MEDICAL CARE IF:   Your migraine becomes severe.  You have a fever.  You have a stiff neck.  You have vision loss.  You have muscular weakness or loss of muscle control.  You start losing your balance or have trouble walking.  You feel faint or pass out.  You have severe symptoms that are different from your first symptoms. MAKE SURE YOU:   Understand these instructions.  Will watch your condition.  Will get help right away if you are not doing well or get worse. Document Released: 09/03/2005 Document Revised: 01/18/2014 Document Reviewed: 05/11/2013 [facility] Patient Information 2015 [employer] [place]. This information is not intended to replace advice given to you by your health care provider. Make sure you discuss any questions you have with your health care provider.

## 2014-11-15 NOTE — Progress Notes (Signed)
   Subjective:    Patient ID: Jocelyn Hayes, female    DOB: 10/09/1970, 44 y.o.   MRN: [id number]  HPI  This note is being scribed by Mila MerryJean Harrell, CMA for Dr. Robert Bellowhris Guest MD Patient here today requesting relpax for her headaches, Insurance does not cover without prior authorization. She states that relpax works better for her she has been taking it for a while. We will obtain paper work for prior authorization. Hx of migraines for years, controlled wonderfully with relpax 40mg . Usually associated with menses.Feels fine now. Reviewed and edited by dr. Perrin MalteseGuest.   Review of Systems     Objective:   Physical Exam  Constitutional: She is oriented to person, place, and time. She appears well-developed and well-nourished. No distress.  HENT:  Head: Normocephalic.  Eyes: EOM are normal.  Pulmonary/Chest: Effort normal.  Neurological: She is alert and oriented to person, place, and time. She exhibits normal muscle tone.  Psychiatric: She has a normal mood and affect. Her behavior is normal. Judgment normal.  Vitals reviewed.         Assessment & Plan:  Prior authorization for relpax to be filed and faxed. Counseled on meds.

## 2014-11-15 NOTE — Telephone Encounter (Signed)
[employer] called to get prior authorization for Relpax, due to patient's previous allergic reaction ("severe," SOB) to a different triptan. She has been stable on Relpax for years since then.  Dr. Perrin MalteseGuest spoke with the [facility] representative and provided the above information. Prior authorization given for Relpax.

## 2015-05-19 HISTORY — PX: CERVICAL DISC SURGERY: SHX588

## 2015-05-24 ENCOUNTER — Ambulatory Visit (INDEPENDENT_AMBULATORY_CARE_PROVIDER_SITE_OTHER): Payer: BLUE CROSS/BLUE SHIELD | Admitting: Internal Medicine

## 2015-05-24 VITALS — BP 130/70 | HR 67 | Temp 98.1°F | Resp 16 | Ht 63.75 in | Wt 127.0 lb

## 2015-05-24 DIAGNOSIS — K429 Umbilical hernia without obstruction or gangrene: Secondary | ICD-10-CM | POA: Diagnosis not present

## 2015-05-24 NOTE — Progress Notes (Signed)
   Subjective:    Patient ID: Jocelyn Hayes, female    DOB: 1970/11/03, 44 y.o.   MRN: [id number] This chart was scribed for Ellamae Sia, MD by Jolene Provost, Medical Scribe. This patient was seen in Room 5 and the patient's care was started a 4:14 PM.  Chief Complaint  Patient presents with  . umbilical pain    Onset 1 month  . Constipation    Onset 1 week    HPI HPI Comments: Jocelyn Hayes is a 44 y.o. female who presents to [facility] complaining of a possible hernia. For several months she has had episodic tenderness above the umbilicus. About one month ago she had to actually push a tender bubble back in to position before the pain would go away.   Over the past few days she has been constipated, gassy, had some mild abdominal pain and past day has reduced appetite. She states over the last several months she has restarted running. She has also been doing major home renovations over the last few weeks.  She states she ran yesterday without pain.  No diarrhea No menstrual problems  Review of Systems  Constitutional: Negative for fever and chills.  Gastrointestinal: Positive for nausea, abdominal pain and constipation. Negative for vomiting.  Neurological: Negative for weakness.       Objective:   Physical Exam  Constitutional: She is oriented to person, place, and time. She appears well-developed and well-nourished. No distress.  HENT:  Head: Normocephalic and atraumatic.  Eyes: Pupils are equal, round, and reactive to light.  Neck: Neck supple.  Cardiovascular: Normal rate.   Pulmonary/Chest: Effort normal. No respiratory distress.  Abdominal: Soft. There is tenderness.  She is tender around the umbilicus with a 1cm defect superiorly with no protrusion. Tender also over the LLQ without rebound.   Musculoskeletal: Normal range of motion.  Neurological: She is alert and oriented to person, place, and time. Coordination normal.  Skin: Skin is warm and dry. She is not  diaphoretic.  Psychiatric: She has a normal mood and affect. Her behavior is normal.  Nursing note and vitals reviewed.   Filed Vitals:   05/24/15 1539  BP: 130/70  Pulse: 67  Temp: 98.1 F (36.7 C)  TempSrc: Oral  Resp: 16  Height: 5' 3.75" (1.619 m)  Weight: 127 lb (57.607 kg)  SpO2: 99%       Assessment & Plan:  Problem #1 small umbilical hernia We discussed the options and she would like to avoid surgery if possible  Problem #2 recent constipation With no concerning symptoms or findings on exam she will start with MiraLAX twice a day and follow-up if not resolved in 1 week  I have completed the patient encounter in its entirety as documented by the scribe, with editing by me where necessary. Mamye Bolds P. Merla Riches, M.D.

## 2015-05-29 ENCOUNTER — Ambulatory Visit (INDEPENDENT_AMBULATORY_CARE_PROVIDER_SITE_OTHER): Payer: BLUE CROSS/BLUE SHIELD | Admitting: Physician Assistant

## 2015-05-29 VITALS — BP 122/72 | HR 68 | Temp 97.7°F | Resp 16 | Ht 63.0 in | Wt 127.0 lb

## 2015-05-29 DIAGNOSIS — M62830 Muscle spasm of back: Secondary | ICD-10-CM | POA: Diagnosis not present

## 2015-05-29 DIAGNOSIS — M546 Pain in thoracic spine: Secondary | ICD-10-CM | POA: Diagnosis not present

## 2015-05-29 MED ORDER — KETOROLAC TROMETHAMINE 60 MG/2ML IM SOLN
60.0000 mg | Freq: Once | INTRAMUSCULAR | Status: AC
  Administered 2015-05-29: 60 mg via INTRAMUSCULAR

## 2015-05-29 MED ORDER — CYCLOBENZAPRINE HCL 5 MG PO TABS: 5.0000 mg | ORAL_TABLET | Freq: Three times a day (TID) | ORAL | Status: DC | PRN

## 2015-05-29 MED ORDER — HYDROCODONE-ACETAMINOPHEN 5-325 MG PO TABS: 1.0000 | ORAL_TABLET | Freq: Four times a day (QID) | ORAL | Status: DC | PRN

## 2015-05-29 MED ORDER — DICLOFENAC SODIUM 75 MG PO TBEC: 75.0000 mg | DELAYED_RELEASE_TABLET | Freq: Two times a day (BID) | ORAL | Status: DC

## 2015-05-29 NOTE — Patient Instructions (Addendum)
Heat to the area -  recheck if no better in 4-5 days or if worse return to clinic

## 2015-05-29 NOTE — Progress Notes (Signed)
Jocelyn Hayes  MRN: [id number] DOB: 10/22/1970  Subjective:  Pt presents to clinic with left thoracic back pain that started 4 days ago.  She did house work 1 week ago where she tore down walls and then carried sheet rock up the stairs and then she was sore but then 4 days ago she got a terrible muscle spasms under her left shoulder that has continued to get worse.  Yesterday she went to a chiropractor and it really did not help help and last night she did not sleep at all from the pain.  She finds that she cannot do anything but stand and hold her arms above her head because anything else makes the pain worse.  She has been taking Aleve and motrin for the last 3 days but it does not seem to be much better.  She has never had something like this before.    Patient Active Problem List   Diagnosis Date Noted  . Migraine 11/15/2014    Current Outpatient Prescriptions on File Prior to Visit  Medication Sig Dispense Refill  . cetirizine (ZYRTEC) 10 MG tablet Take 10 mg by mouth daily.    Marland Kitchen eletriptan (RELPAX) 40 MG tablet Take 1 tablet (40 mg total) by mouth as needed for migraine or headache. One tablet by mouth at onset of headache. May repeat in 2 hours if headache persists or recurs. 10 tablet 12  . fluticasone (FLONASE) 50 MCG/ACT nasal spray Place 2 sprays into both nostrils daily.    . Fluticasone-Salmeterol (ADVAIR DISKUS) 250-50 MCG/DOSE AEPB Inhale 1 puff into the lungs 2 (two) times daily. (Patient not taking: Reported on 05/24/2015) 1 each 3   No current facility-administered medications on file prior to visit.    Allergies  Allergen Reactions  . Molds & Smuts   . Other     Cats, grass, oak trees    Review of Systems  Musculoskeletal: Positive for back pain and neck pain (resulting from position she has to keep her arms in to decrease the pain between her shoulder blades). Negative for myalgias.   Objective:  BP 122/72 mmHg  Pulse 68  Temp(Src) 97.7 F (36.5 C) (Oral)   Resp 16  Ht 5\' 3"  (1.6 m)  Wt 127 lb (57.607 kg)  BMI 22.50 kg/m2  SpO2 99%  LMP 05/13/2015  Physical Exam  Constitutional: She is oriented to person, place, and time and well-developed, well-nourished, and in no distress.  HENT:  Head: Normocephalic and atraumatic.  Right Ear: Hearing and external ear normal.  Left Ear: Hearing and external ear normal.  Eyes: Conjunctivae are normal.  Neck: Normal range of motion.  Pulmonary/Chest: Effort normal.  Musculoskeletal:       Cervical back: She exhibits decreased range of motion (slight decrease when rotating to the right side due to pain on the left side) and spasm. She exhibits no bony tenderness.       Back:  Neurological: She is alert and oriented to person, place, and time. She has normal strength and normal reflexes. Gait normal. Gait normal.  Skin: Skin is warm and dry.  Psychiatric: Mood, memory, affect and judgment normal.  Vitals reviewed.   Assessment and Plan :  Muscle spasm of back - Plan: cyclobenzaprine (FLEXERIL) 5 MG tablet  Left-sided thoracic back pain - Plan: diclofenac (VOLTAREN) 75 MG EC tablet, ketorolac (TORADOL) injection 60 mg, HYDROcodone-acetaminophen (NORCO/VICODIN) 5-325 MG per tablet   Heat to the area - we discussed that getting her arms in  a normal position will help prevent further muscle spasms in additional locations.    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  Benny Lennert PA-C  [facility] 05/29/2015 8:47 AM

## 2015-05-30 ENCOUNTER — Telehealth: Payer: Self-pay

## 2015-05-30 ENCOUNTER — Ambulatory Visit (INDEPENDENT_AMBULATORY_CARE_PROVIDER_SITE_OTHER): Payer: BLUE CROSS/BLUE SHIELD | Admitting: Family Medicine

## 2015-05-30 VITALS — BP 126/76 | HR 71 | Temp 97.2°F | Resp 16 | Ht 64.0 in | Wt 127.6 lb

## 2015-05-30 DIAGNOSIS — M546 Pain in thoracic spine: Secondary | ICD-10-CM | POA: Diagnosis not present

## 2015-05-30 MED ORDER — PREDNISONE 20 MG PO TABS: ORAL_TABLET | ORAL | Status: DC

## 2015-05-30 NOTE — Patient Instructions (Signed)
We are going to add prednisone to your regimen- take it as directed.   While you are on the prednisone do not use the diclofenac other NSAID medication   You can increase the flexeril to 10 mg three times a day if needed You can also take the hydrocodone 1 or 2 every 6 hours if needed for pain

## 2015-05-30 NOTE — Progress Notes (Signed)
[facility] [address], [place] [address] [phone]  Date:  05/30/2015   Name:  Jocelyn Hayes   DOB:  [birth year]   MRN:  [id number]  PCP:  No PCP Per Patient    Chief Complaint: Follow-up and Medication Problem   History of Present Illness:  Jocelyn Hayes is a 44 y.o. very pleasant female patient who presents with the following:  Here today to recheck neck and back pain- she was seen yesterday and was noted to have severe left sided thoracic muscle spasm.  She was treated with IM toradol, voltaren, flexeril, and norco 5.     She has never had this in the past.  She went to the chiropractor Saturday and today.  (today is Monday) Her chiropractor took x-rays and felt that she might have a bulging disc.  He did not see any fracture  She has some tingling and discomfort in her left arm, will sometimes feel numb.   She has noted the sx in her left arm for about 5 days- getting worse.  Her chiro had suggested prednisone and she would like to try this. Has used pred in the past for allergies without any problems   She is generally in good health   Patient Active Problem List   Diagnosis Date Noted  . Migraine 11/15/2014    Past Medical History  Diagnosis Date  . Allergy   . Asthma   . Migraine     Past Surgical History  Procedure Laterality Date  . Cesarean section      Social History  Substance Use Topics  . Smoking status: Never Smoker   . Smokeless tobacco: Never Used  . Alcohol Use: 0.0 oz/week    0 Standard drinks or equivalent per week     Comment: socially    Family History  Problem Relation Age of Onset  . Cancer Maternal Grandfather   . Cancer Paternal Grandfather     Allergies  Allergen Reactions  . Molds & Smuts   . Other     Cats, grass, oak trees    Medication list has been reviewed and updated.  Current Outpatient Prescriptions on File Prior to Visit  Medication Sig Dispense Refill  . cetirizine (ZYRTEC) 10  MG tablet Take 10 mg by mouth daily.    . cyclobenzaprine (FLEXERIL) 5 MG tablet Take 1 tablet (5 mg total) by mouth 3 (three) times daily as needed for muscle spasms. 30 tablet 0  . diclofenac (VOLTAREN) 75 MG EC tablet Take 1 tablet (75 mg total) by mouth 2 (two) times daily. 30 tablet 0  . fluticasone (FLONASE) 50 MCG/ACT nasal spray Place 2 sprays into both nostrils daily.    . Fluticasone-Salmeterol (ADVAIR DISKUS) 250-50 MCG/DOSE AEPB Inhale 1 puff into the lungs 2 (two) times daily. 1 each 3  . HYDROcodone-acetaminophen (NORCO/VICODIN) 5-325 MG per tablet Take 1 tablet by mouth every 6 (six) hours as needed for moderate pain. 20 tablet 0  . eletriptan (RELPAX) 40 MG tablet Take 1 tablet (40 mg total) by mouth as needed for migraine or headache. One tablet by mouth at onset of headache. May repeat in 2 hours if headache persists or recurs. (Patient not taking: Reported on 05/30/2015) 10 tablet 12   No current facility-administered medications on file prior to visit.    Review of Systems:  As per HPI- otherwise negative.   Physical Examination: Filed Vitals:   05/30/15 1400  BP: 126/76  Pulse: 71  Temp: 97.2 F (36.2 C)  Resp: 16   Filed Vitals:   05/30/15 1400  Height: 5\' 4"  (1.626 m)  Weight: 127 lb 9.6 oz (57.879 kg)   Body mass index is 21.89 kg/(m^2). Ideal Body Weight: Weight in (lb) to have BMI = 25: 145.3  GEN: WDWN, NAD, Non-toxic, A & O x 3, petite build, prefers to hold her left arm over her head to relieve pain HEENT: Atraumatic, Normocephalic. Neck supple. No masses, No LAD. Ears and Nose: No external deformity. CV: RRR, No M/G/R. No JVD. No thrill. No extra heart sounds. PULM: CTA B, no wheezes, crackles, rhonchi. No retractions. No resp. distress. No accessory muscle use. EXTR: No c/c/e NEURO Normal gait.  PSYCH: Normally interactive. Conversant. Not depressed or anxious appearing.  Calm demeanor.  Left sided thoracic tenderness and spasm around and  inferior to the scapula Normal strength of both arms, normal sensation of both arms No rash on her skin  Assessment and Plan: Left-sided thoracic back pain - Plan: predniSONE (DELTASONE) 20 MG tablet  Here today with continued left sided thoracic pain and now numbness and tingling in the left arm suggestive of a nerve impingement.  She would like to try prednisone which is a reasonable idea Hold voltarn/ NSAIDs while on the pred Counseled that she can increase the dosage of her flexeril and vicodin some if needed for pain control-  See patient instructions for more details.   Cautioned regarding increased sedation with increased dosage of medication signed Abbe Amsterdam, MD

## 2015-05-30 NOTE — Telephone Encounter (Signed)
Pt states seen yesterday,given pain  meds ,she has essentially seen no relief in back pain,please advise    Best phone for pt is [phone]    Pharmacy [facility]

## 2015-05-31 NOTE — Telephone Encounter (Signed)
Addressed- I saw her in clinic yesterday

## 2015-06-02 ENCOUNTER — Ambulatory Visit (INDEPENDENT_AMBULATORY_CARE_PROVIDER_SITE_OTHER): Payer: BLUE CROSS/BLUE SHIELD | Admitting: Family Medicine

## 2015-06-02 VITALS — BP 112/70 | HR 77 | Temp 98.2°F | Resp 16 | Ht 64.0 in | Wt 129.0 lb

## 2015-06-02 DIAGNOSIS — S40822A Blister (nonthermal) of left upper arm, initial encounter: Secondary | ICD-10-CM

## 2015-06-02 DIAGNOSIS — M546 Pain in thoracic spine: Secondary | ICD-10-CM

## 2015-06-02 DIAGNOSIS — M542 Cervicalgia: Secondary | ICD-10-CM

## 2015-06-02 DIAGNOSIS — M5412 Radiculopathy, cervical region: Secondary | ICD-10-CM

## 2015-06-02 DIAGNOSIS — M79622 Pain in left upper arm: Secondary | ICD-10-CM

## 2015-06-02 DIAGNOSIS — M25522 Pain in left elbow: Secondary | ICD-10-CM

## 2015-06-02 MED ORDER — HYDROCODONE-ACETAMINOPHEN 5-325 MG PO TABS: 1.0000 | ORAL_TABLET | Freq: Four times a day (QID) | ORAL | Status: DC | PRN

## 2015-06-02 NOTE — Patient Instructions (Signed)
This still appears to be from your neck, and based on decreased triceps reflex, would indicate pinched nerve at C7. Okay to continue Flexeril, hydrocodone as needed, be careful combining these 2 medications as discussed. As you're slightly improving, the prednisone can be continued, but if not significantly improved by Monday, will obtain MRI. I suspect the blister on your arm is from the heating pad, so avoid heat to this area, but I will also check for shingles virus which is much less likely. Avoid repetitive use of your arms for now, but okay to wash through the pool or exercise bike as needed.Return to the clinic or go to the nearest emergency room if any of your symptoms worsen or new symptoms occur. Cervical Radiculopathy Cervical radiculopathy happens when a nerve in the neck is pinched or bruised by a slipped (herniated) disk or by arthritic changes in the bones of the cervical spine. This can occur due to an injury or as part of the normal aging process. Pressure on the cervical nerves can cause pain or numbness that runs from your neck all the way down into your arm and fingers. CAUSES  There are many possible causes, including:  Injury.  Muscle tightness in the neck from overuse.  Swollen, painful joints (arthritis).  Breakdown or degeneration in the bones and joints of the spine (spondylosis) due to aging.  Bone spurs that may develop near the cervical nerves. SYMPTOMS  Symptoms include pain, weakness, or numbness in the affected arm and hand. Pain can be severe or irritating. Symptoms may be worse when extending or turning the neck. DIAGNOSIS  Your caregiver will ask about your symptoms and do a physical exam. He or she may test your strength and reflexes. X-rays, CT scans, and MRI scans may be needed in cases of injury or if the symptoms do not go away after a period of time. Electromyography (EMG) or nerve conduction testing may be done to study how your nerves and muscles are  working. TREATMENT  Your caregiver may recommend certain exercises to help relieve your symptoms. Cervical radiculopathy can, and often does, get better with time and treatment. If your problems continue, treatment options may include:  Wearing a soft collar for short periods of time.  Physical therapy to strengthen the neck muscles.  Medicines, such as nonsteroidal anti-inflammatory drugs (NSAIDs), oral corticosteroids, or spinal injections.  Surgery. Different types of surgery may be done depending on the cause of your problems. HOME CARE INSTRUCTIONS   Put ice on the affected area.  Put ice in a plastic bag.  Place a towel between your skin and the bag.  Leave the ice on for 15-20 minutes, 03-04 times a day or as directed by your caregiver.  If ice does not help, you can try using heat. Take a warm shower or bath, or use a hot water bottle as directed by your caregiver.  You may try a gentle neck and shoulder massage.  Use a flat pillow when you sleep.  Only take over-the-counter or prescription medicines for pain, discomfort, or fever as directed by your caregiver.  If physical therapy was prescribed, follow your caregiver's directions.  If a soft collar was prescribed, use it as directed. SEEK IMMEDIATE MEDICAL CARE IF:   Your pain gets much worse and cannot be controlled with medicines.  You have weakness or numbness in your hand, arm, face, or leg.  You have a high fever or a stiff, rigid neck.  You lose bowel or bladder  control (incontinence).  You have trouble with walking, balance, or speaking. MAKE SURE YOU:   Understand these instructions.  Will watch your condition.  Will get help right away if you are not doing well or get worse. Document Released: 05/29/2001 Document Revised: 11/26/2011 Document Reviewed: 04/17/2011 [facility] Patient Information 2015 [place], [place]. This information is not intended to replace advice given to you by your health care  provider. Make sure you discuss any questions you have with your health care provider.

## 2015-06-02 NOTE — Progress Notes (Addendum)
Subjective:  This chart was scribed for Meredith Staggers, MD by [facility], medical scribe at [facility].The patient was seen in exam room 11 and the patient's care was started at 10:03 AM.   Patient ID: Jocelyn Hayes, female    DOB: 12/24/1970, 44 y.o.   MRN: [id number] Chief Complaint  Patient presents with  . Back Pain    x 4 days  . Follow-up    Dr. Neva Seat   HPI HPI Comments: Jocelyn Hayes is a 44 y.o. female who presents to [facility] complaining of left sided back pain for four days.  Initially seen 4 days ago for left sided back pain, treated with Toradol, Voltaren, flexeril, hydrocodone as needed. Seen one day later by Dr. Patsy Lager. Did note having some tingling in her left arm at times. Also being treated by a chiropractor. suspected nerve impingement. Started on prednisone. NSAID and Vicodin advised to hold off NSAID while on prednisone. 8 day taper starting at 60 mg.  Pain slightly better but not resolved and she still has numbness and tingling running down her left arm. Pain today 5/10 on Friday 8/10. 4 visits with her chiropractor. 4-5 flexeril and hydrocodone throughout the day. Injured her back while working on her home. Lifting and carrying dry wall. Pain began 1-2 days after. No weakness. No neck or back issues in the past. No surgeries. No chest pain or heart issues. No side effects on the prednisone. No cough, or sob. Fell asleep on her heating pad and she has a blister on her left upper arm.   Patient Active Problem List   Diagnosis Date Noted  . Migraine 11/15/2014   Past Medical History  Diagnosis Date  . Allergy   . Asthma   . Migraine    Past Surgical History  Procedure Laterality Date  . Cesarean section     Allergies  Allergen Reactions  . Molds & Smuts   . Other     Cats, grass, oak trees   Prior to Admission medications   Medication Sig Start Date End Date Taking? Authorizing Provider  cetirizine (ZYRTEC)  10 MG tablet Take 10 mg by mouth daily.   Yes Historical Provider, MD  cyclobenzaprine (FLEXERIL) 5 MG tablet Take 1 tablet (5 mg total) by mouth 3 (three) times daily as needed for muscle spasms. 05/29/15  Yes Morrell Riddle, PA-C  diclofenac (VOLTAREN) 75 MG EC tablet Take 1 tablet (75 mg total) by mouth 2 (two) times daily. 05/29/15  Yes Morrell Riddle, PA-C  eletriptan (RELPAX) 40 MG tablet Take 1 tablet (40 mg total) by mouth as needed for migraine or headache. One tablet by mouth at onset of headache. May repeat in 2 hours if headache persists or recurs. 06/11/14  Yes Carmelina Dane, MD  fluticasone (FLONASE) 50 MCG/ACT nasal spray Place 2 sprays into both nostrils daily.   Yes Historical Provider, MD  Fluticasone-Salmeterol (ADVAIR DISKUS) 250-50 MCG/DOSE AEPB Inhale 1 puff into the lungs 2 (two) times daily. 05/31/14  Yes Shade Flood, MD  HYDROcodone-acetaminophen (NORCO/VICODIN) 5-325 MG per tablet Take 1 tablet by mouth every 6 (six) hours as needed for moderate pain. 05/29/15  Yes Morrell Riddle, PA-C  predniSONE (DELTASONE) 20 MG tablet Take 3 pills a day for 2 days, then 2 pills a day for 3 days, then 1 pill a day for 3 days 05/30/15  Yes Pearline Cables, MD   Social History  Social History  . Marital Status: Married    Spouse Name: N/A  . Number of Children: N/A  . Years of Education: N/A   Occupational History  . Not on file.   Social History Main Topics  . Smoking status: Never Smoker   . Smokeless tobacco: Never Used  . Alcohol Use: 0.0 oz/week    0 Standard drinks or equivalent per week     Comment: socially  . Drug Use: No  . Sexual Activity: Yes    Birth Control/ Protection: Other-see comments     Comment: Vasectomy   Other Topics Concern  . Not on file   Social History Narrative   Review of Systems  Respiratory: Negative for cough and shortness of breath.   Cardiovascular: Negative for chest pain.  Musculoskeletal: Positive for back pain.  Neurological:  Positive for weakness and numbness.      Objective:  BP 112/70 mmHg  Pulse 77  Temp(Src) 98.2 F (36.8 C) (Oral)  Resp 16  Ht 5\' 4"  (1.626 m)  Wt 129 lb (58.514 kg)  BMI 22.13 kg/m2  SpO2 99%  LMP 04/17/2015 Physical Exam  Constitutional: She is oriented to person, place, and time. She appears well-developed and well-nourished. No distress.  HENT:  Head: Normocephalic and atraumatic.  Eyes: Pupils are equal, round, and reactive to light.  Neck: Normal range of motion.  Tenderness along left paraspinal muscles into the trapezius. Slight spasms of the rhomboid as well. Decreased extension, some reproduction of symptoms. Flexion is intact. Equal rotation of the neck. Decreased left lateral of the neck with reproduction of symptoms. Full rotator cuff strength of the left shoulder and full ROM. Equal grip strength.   Cardiovascular: Normal rate, regular rhythm and normal heart sounds.  Exam reveals no gallop and no friction rub.   No murmur heard. Pulmonary/Chest: Effort normal. No respiratory distress.  Musculoskeletal: Normal range of motion.  Neurological: She is alert and oriented to person, place, and time.  Reflex Scores:      Tricep reflexes are 2+ on the right side.      Bicep reflexes are 2+ on the right side and 2+ on the left side.      Brachioradialis reflexes are 2+ on the right side and 2+ on the left side. Unable to obtain tricep reflex on the left. 2+ on the right.  Skin: Skin is warm and dry.  Left upper arm: Lateral aspect small blister, clear fluid 1 cm across with extension of erythema 1 cm distally. Thought to be from a heating pad. No other vascular rash noted. cleansed with alcohol swab. #11 blade to open blister laterally. Clear fluid obtained for culture.  Psychiatric: She has a normal mood and affect. Her behavior is normal.  Nursing note and vitals reviewed.     Assessment & Plan:   Jocelyn Hayes is a 44 y.o. female Blister of upper arm, left, initial  encounter - Plan: Herpes simplex virus culture  Cervical radiculopathy at C7 - Plan: MR Cervical Spine Wo Contrast  Pain in joint, upper arm, left - Plan: MR Cervical Spine Wo Contrast  Neck pain on left side - Plan: MR Cervical Spine Wo Contrast  Left-sided thoracic back pain - Plan: HYDROcodone-acetaminophen (NORCO/VICODIN) 5-325 MG per tablet  Suspected left-sided cervical radiculopathy, with C7 most likely area with decreased reflex in the triceps. Slight/minimal improvement now with prednisone. Will schedule MRI 4 days if not significantly improving at that time. Okay to continue Flexeril and hydrocodone if  needed for pain and muscle spasm in the meantime. Side effects discussed.  Suspected burn, mild/partial thickness on left arm with mild blister. Avoid heating pad to this area, soap and water, and keep clean and covered. I did open the blister and obtained HSV culture with her arm symptoms and burning/tingling, but less likely shingles. No other rash seen.  Meds ordered this encounter  Medications  . HYDROcodone-acetaminophen (NORCO/VICODIN) 5-325 MG per tablet    Sig: Take 1-2 tablets by mouth every 6 (six) hours as needed for moderate pain.    Dispense:  30 tablet    Refill:  0   Patient Instructions  This still appears to be from your neck, and based on decreased triceps reflex, would indicate pinched nerve at C7. Okay to continue Flexeril, hydrocodone as needed, be careful combining these 2 medications as discussed. As you're slightly improving, the prednisone can be continued, but if not significantly improved by Monday, will obtain MRI. I suspect the blister on your arm is from the heating pad, so avoid heat to this area, but I will also check for shingles virus which is much less likely. Avoid repetitive use of your arms for now, but okay to wash through the pool or exercise bike as needed.Return to the clinic or go to the nearest emergency room if any of your symptoms worsen or  new symptoms occur. Cervical Radiculopathy Cervical radiculopathy happens when a nerve in the neck is pinched or bruised by a slipped (herniated) disk or by arthritic changes in the bones of the cervical spine. This can occur due to an injury or as part of the normal aging process. Pressure on the cervical nerves can cause pain or numbness that runs from your neck all the way down into your arm and fingers. CAUSES  There are many possible causes, including:  Injury.  Muscle tightness in the neck from overuse.  Swollen, painful joints (arthritis).  Breakdown or degeneration in the bones and joints of the spine (spondylosis) due to aging.  Bone spurs that may develop near the cervical nerves. SYMPTOMS  Symptoms include pain, weakness, or numbness in the affected arm and hand. Pain can be severe or irritating. Symptoms may be worse when extending or turning the neck. DIAGNOSIS  Your caregiver will ask about your symptoms and do a physical exam. He or she may test your strength and reflexes. X-rays, CT scans, and MRI scans may be needed in cases of injury or if the symptoms do not go away after a period of time. Electromyography (EMG) or nerve conduction testing may be done to study how your nerves and muscles are working. TREATMENT  Your caregiver may recommend certain exercises to help relieve your symptoms. Cervical radiculopathy can, and often does, get better with time and treatment. If your problems continue, treatment options may include:  Wearing a soft collar for short periods of time.  Physical therapy to strengthen the neck muscles.  Medicines, such as nonsteroidal anti-inflammatory drugs (NSAIDs), oral corticosteroids, or spinal injections.  Surgery. Different types of surgery may be done depending on the cause of your problems. HOME CARE INSTRUCTIONS   Put ice on the affected area.  Put ice in a plastic bag.  Place a towel between your skin and the bag.  Leave the ice on  for 15-20 minutes, 03-04 times a day or as directed by your caregiver.  If ice does not help, you can try using heat. Take a warm shower or bath, or use a  hot water bottle as directed by your caregiver.  You may try a gentle neck and shoulder massage.  Use a flat pillow when you sleep.  Only take over-the-counter or prescription medicines for pain, discomfort, or fever as directed by your caregiver.  If physical therapy was prescribed, follow your caregiver's directions.  If a soft collar was prescribed, use it as directed. SEEK IMMEDIATE MEDICAL CARE IF:   Your pain gets much worse and cannot be controlled with medicines.  You have weakness or numbness in your hand, arm, face, or leg.  You have a high fever or a stiff, rigid neck.  You lose bowel or bladder control (incontinence).  You have trouble with walking, balance, or speaking. MAKE SURE YOU:   Understand these instructions.  Will watch your condition.  Will get help right away if you are not doing well or get worse. Document Released: 05/29/2001 Document Revised: 11/26/2011 Document Reviewed: 04/17/2011 [facility] Patient Information 2015 [place], [place]. This information is not intended to replace advice given to you by your health care provider. Make sure you discuss any questions you have with your health care provider.   I personally performed the services described in this documentation, which was scribed in my presence. The recorded information has been reviewed and considered, and addended by me as needed.

## 2015-06-06 LAB — HERPES SIMPLEX VIRUS CULTURE: ORGANISM ID, BACTERIA: NOT DETECTED

## 2015-06-08 ENCOUNTER — Other Ambulatory Visit: Payer: Self-pay | Admitting: Family Medicine

## 2015-06-08 ENCOUNTER — Ambulatory Visit
Admission: RE | Admit: 2015-06-08 | Discharge: 2015-06-08 | Disposition: A | Discharge status: Home / Self Care | Payer: BLUE CROSS/BLUE SHIELD | Source: Ambulatory Visit | Attending: Family Medicine | Admitting: Family Medicine

## 2015-06-08 ENCOUNTER — Telehealth: Payer: Self-pay | Admitting: Family Medicine

## 2015-06-08 DIAGNOSIS — M62830 Muscle spasm of back: Secondary | ICD-10-CM

## 2015-06-08 DIAGNOSIS — M50223 Other cervical disc displacement at C6-C7 level: Secondary | ICD-10-CM

## 2015-06-08 DIAGNOSIS — M542 Cervicalgia: Secondary | ICD-10-CM

## 2015-06-08 DIAGNOSIS — M546 Pain in thoracic spine: Secondary | ICD-10-CM

## 2015-06-08 DIAGNOSIS — M5412 Radiculopathy, cervical region: Secondary | ICD-10-CM

## 2015-06-08 DIAGNOSIS — M25522 Pain in left elbow: Secondary | ICD-10-CM

## 2015-06-08 NOTE — Telephone Encounter (Signed)
Please see previous message

## 2015-06-08 NOTE — Telephone Encounter (Signed)
Patient returned call about lab results. Gave her the following message from Dr. Neva Seat: Notes Recorded by Shade Flood, MD on 06/07/2015 at 12:15 AM Call patient - skin testing negative for shingles virus. Continue care as discussed at last office visit, return if any worsening, and let me know if there are any questions." Patient understood these results and agrees to follow the instructions of Dr. Neva Seat to RTC if she becomes worse. Patient also wants Dr. Neva Seat to know that she had her MRI done this morning and the scans are uploaded to her chart now. Please return her call if further follow up is necessary.    [phone]

## 2015-06-09 MED ORDER — PREDNISONE 20 MG PO TABS: ORAL_TABLET | ORAL | Status: DC

## 2015-06-09 MED ORDER — CYCLOBENZAPRINE HCL 5 MG PO TABS: 5.0000 mg | ORAL_TABLET | Freq: Three times a day (TID) | ORAL | Status: DC | PRN

## 2015-06-09 MED ORDER — HYDROCODONE-ACETAMINOPHEN 5-325 MG PO TABS: 1.0000 | ORAL_TABLET | Freq: Four times a day (QID) | ORAL | Status: DC | PRN

## 2015-06-09 NOTE — Telephone Encounter (Signed)
Called patient. S/p prednisone taper. Pain migrated down into arm. Slipped and fell in shower yesterday, fell onto knee, noted more pain into upper back/shoulder - same area, just flared up/reaggravated. No new injuries. Felt like prednisone may have been helping some as migration of symptoms into arm.  Has upcoming trip to [place]. Advised that I have ordered place a referral to neurosurgery and waiting for that appointment, I do not have details of that appointment at this time. We'll try one more course of prednisone, side effects discussed. Also will refill Flexeril and hydrocodone for now if needed for more severe pain. RTC precautions discussed prior to neurosurgery eval if needed.  If she has not heard any information by the first of next week, call and check status of that referral.

## 2015-06-13 ENCOUNTER — Other Ambulatory Visit: Payer: Self-pay | Admitting: Emergency Medicine

## 2015-06-16 ENCOUNTER — Other Ambulatory Visit: Payer: Self-pay

## 2015-06-20 ENCOUNTER — Other Ambulatory Visit: Payer: Self-pay

## 2015-06-20 DIAGNOSIS — Z1231 Encounter for screening mammogram for malignant neoplasm of breast: Secondary | ICD-10-CM

## 2015-07-18 ENCOUNTER — Ambulatory Visit
Admission: RE | Admit: 2015-07-18 | Discharge: 2015-07-18 | Disposition: A | Discharge status: Home / Self Care | Payer: BLUE CROSS/BLUE SHIELD | Source: Ambulatory Visit

## 2015-07-18 DIAGNOSIS — Z1231 Encounter for screening mammogram for malignant neoplasm of breast: Secondary | ICD-10-CM

## 2015-08-01 ENCOUNTER — Other Ambulatory Visit: Payer: Self-pay | Admitting: Internal Medicine

## 2015-08-04 ENCOUNTER — Encounter: Payer: Self-pay | Admitting: Obstetrics and Gynecology

## 2015-08-04 ENCOUNTER — Ambulatory Visit (INDEPENDENT_AMBULATORY_CARE_PROVIDER_SITE_OTHER): Payer: BLUE CROSS/BLUE SHIELD | Admitting: Obstetrics and Gynecology

## 2015-08-04 VITALS — BP 112/74 | HR 80 | Resp 18 | Ht 63.25 in | Wt 122.4 lb

## 2015-08-04 DIAGNOSIS — N63 Unspecified lump in breast: Secondary | ICD-10-CM | POA: Diagnosis not present

## 2015-08-04 DIAGNOSIS — Z0000 Encounter for general adult medical examination without abnormal findings: Secondary | ICD-10-CM | POA: Diagnosis not present

## 2015-08-04 DIAGNOSIS — Z01419 Encounter for gynecological examination (general) (routine) without abnormal findings: Secondary | ICD-10-CM

## 2015-08-04 DIAGNOSIS — N6310 Unspecified lump in the right breast, unspecified quadrant: Secondary | ICD-10-CM

## 2015-08-04 LAB — POCT URINALYSIS DIPSTICK
Bilirubin, UA: NEGATIVE
Blood, UA: NEGATIVE
Glucose, UA: NEGATIVE
Ketones, UA: NEGATIVE
Leukocytes, UA: NEGATIVE
Nitrite, UA: NEGATIVE
PROTEIN UA: NEGATIVE
UROBILINOGEN UA: NEGATIVE
pH, UA: 5

## 2015-08-04 LAB — HEMOGLOBIN, FINGERSTICK: HEMOGLOBIN, FINGERSTICK: 12.6 g/dL (ref 12.0–16.0)

## 2015-08-04 LAB — COMPREHENSIVE METABOLIC PANEL
ALK PHOS: 87 U/L (ref 33–115)
ALT: 10 U/L (ref 6–29)
AST: 30 U/L (ref 10–35)
Albumin: 4.4 g/dL (ref 3.6–5.1)
BILIRUBIN TOTAL: 0.5 mg/dL (ref 0.2–1.2)
BUN: 9 mg/dL (ref 7–25)
CALCIUM: 9.5 mg/dL (ref 8.6–10.2)
CO2: 29 mmol/L (ref 20–31)
Chloride: 101 mmol/L (ref 98–110)
Creat: 0.74 mg/dL (ref 0.50–1.10)
Glucose, Bld: 76 mg/dL (ref 65–99)
Potassium: 3.9 mmol/L (ref 3.5–5.3)
Sodium: 139 mmol/L (ref 135–146)
Total Protein: 7.3 g/dL (ref 6.1–8.1)

## 2015-08-04 LAB — LIPID PANEL
CHOL/HDL RATIO: 2.5 ratio (ref <=5.0)
CHOLESTEROL: 170 mg/dL (ref 125–200)
HDL: 68 mg/dL (ref >=46)
LDL Cholesterol: 87 mg/dL (ref <130)
Triglycerides: 74 mg/dL (ref <150)
VLDL: 15 mg/dL (ref <30)

## 2015-08-04 LAB — CBC
HEMATOCRIT: 40 % (ref 36.0–46.0)
Hemoglobin: 12.9 g/dL (ref 12.0–15.0)
MCH: 30.7 pg (ref 26.0–34.0)
MCHC: 32.3 g/dL (ref 30.0–36.0)
MCV: 95.2 fL (ref 78.0–100.0)
MPV: 10 fL (ref 8.6–12.4)
Platelets: 349 10*3/uL (ref 150–400)
RBC: 4.2 MIL/uL (ref 3.87–5.11)
RDW: 12.7 % (ref 11.5–15.5)
WBC: 7.5 10*3/uL (ref 4.0–10.5)

## 2015-08-04 NOTE — Patient Instructions (Signed)

## 2015-08-04 NOTE — Progress Notes (Signed)
Scheduled patient while in office for right breast diagnostic with right breast ultrasound on 08/15/2015 at [facility]. 6 week recheck scheduled for 09/22/2015 at 1 pm with Dr.Silva.

## 2015-08-04 NOTE — Progress Notes (Signed)
Patient ID: Jocelyn Hayes, female   DOB: 12-09-1970, 44 y.o.   MRN: [id number] 44 y.o. [id number] Married Caucasian female here for annual exam.    Menses a little less reliable.  Menses are a week late now.  No extensive bleeding or pain.   Had cervical spine surgery.   Went to [place].   Is unemployed and sensitive about this.  Received severance package.  PCP:  Meredith Staggers, MD  Patient's last menstrual period was 06/29/2015 (exact date).          Sexually active: Yes.   female The current method of family planning is vasectomy.    Exercising: Yes.    walking, yoga, running and swimming. Smoker:  no  Health Maintenance: Pap:  08-23-14 Neg.  HR HPV was negative on 07/29/14 but there were insufficient cells on the pap.  History of abnormal Pap:  no MMG:  07-18-15 3D Density Cat.D/Neg/BiRads1:[facility].   Colonoscopy:  n/a BMD:   n/a  Result  n/a TDaP:  2008 Screening Labs:  Hb today: 12.6, Urine today: Neg   reports that she has never smoked. She has never used smokeless tobacco. She reports that she drinks about 0.6 - 1.8 oz of alcohol per week. She reports that she does not use illicit drugs.  Past Medical History  Diagnosis Date  . Allergy   . Asthma   . Migraine     Past Surgical History  Procedure Laterality Date  . Cesarean section    . Cervical disc surgery  05/2015    --C6,C7--Dr. Lovell Sheehan    Current Outpatient Prescriptions  Medication Sig Dispense Refill  . cetirizine (ZYRTEC) 10 MG tablet Take 10 mg by mouth daily.    Marland Kitchen eletriptan (RELPAX) 40 MG tablet TAKE 1 TABLET BY MOUTH AS NEEDED AT ONSET OF MIGRAINE.  "NO MORE REFILLS WITHOUT OFFICE VISIT" 6 tablet 0  . fluticasone (FLONASE) 50 MCG/ACT nasal spray Place 2 sprays into both nostrils daily.    . Fluticasone-Salmeterol (ADVAIR DISKUS) 250-50 MCG/DOSE AEPB Inhale 1 puff into the lungs 2 (two) times daily. 1 each 3   No current facility-administered medications for this visit.    Family History   Problem Relation Age of Onset  . Cancer Paternal Grandfather     Liver CA?  Marland Kitchen Cancer Maternal Aunt     breast CA    ROS:  Pertinent items are noted in HPI.  Otherwise, a comprehensive ROS was negative.  Exam:   BP 112/74 mmHg  Pulse 80  Resp 18  Ht 5' 3.25" (1.607 m)  Wt 122 lb 6.4 oz (55.52 kg)  BMI 21.50 kg/m2  LMP 06/29/2015 (Exact Date)    General appearance: alert, cooperative and appears stated age Head: Normocephalic, without obvious abnormality, atraumatic Neck: no adenopathy, supple, symmetrical, trachea midline and thyroid normal to inspection and palpation Lungs: clear to auscultation bilaterally Breasts: normal appearance, no masses or tenderness, Inspection negative, No nipple retraction or dimpling, No nipple discharge or bleeding, No axillary or supraclavicular adenopathy on left.  Right breast with 1.5 cm mass at 1:00, nontender, no retractions, nipple discharge or axillary adenopathy.  Heart: regular rate and rhythm Abdomen: soft, non-tender; bowel sounds normal; no masses,  no organomegaly Extremities: extremities normal, atraumatic, no cyanosis or edema Skin: Skin color, texture, turgor normal. No rashes or lesions Lymph nodes: Cervical, supraclavicular, and axillary nodes normal. No abnormal inguinal nodes palpated Neurologic: Grossly normal  Pelvic: External genitalia:  no lesions  Urethra:  normal appearing urethra with no masses, tenderness or lesions              Bartholins and Skenes: normal                 Vagina: normal appearing vagina with normal color and discharge, no lesions              Cervix: no lesions              Pap taken: No. Bimanual Exam:  Uterus:  normal size, contour, position, consistency, mobility, non-tender              Adnexa: normal adnexa and no mass, fullness, tenderness              Rectovaginal: Yes.  .  Confirms.              Anus:  normal sphincter tone, no lesions  Chaperone was present for  exam.  Assessment:   Well woman visit with normal exam. Right breast lump.   Plan: Yearly mammogram recommended after age 45.   Will schedule diagnostic right mammogram and ultrasound.  Recommended self breast exam.  Pap and HR HPV as above. Recommendations for Calcium, Vitamin D, regular exercise program including cardiovascular and weight bearing exercise. Labs performed.  Yes.  .   See orders.  Routine labs. Refills given on medications.  No..    Follow up annually and prn.      After visit summary provided.

## 2015-08-05 ENCOUNTER — Telehealth: Payer: Self-pay

## 2015-08-05 NOTE — Telephone Encounter (Signed)
Spoke with patient. Advised all results are normal. Patient is agreeable.  Routing to provider for final review. Patient agreeable to disposition. Will close encounter.

## 2015-08-05 NOTE — Telephone Encounter (Signed)
-----   Message from Patton SallesBrook E Amundson C Silva, MD sent at 08/05/2015  6:57 AM EST ----- Please report all normal results to patient.

## 2015-08-05 NOTE — Telephone Encounter (Signed)
Left message to call Clevester Helzer at [phone]. 

## 2015-08-05 NOTE — Telephone Encounter (Signed)
Patient returning your call [phone]

## 2015-08-15 ENCOUNTER — Ambulatory Visit
Admission: RE | Admit: 2015-08-15 | Discharge: 2015-08-15 | Disposition: A | Discharge status: Home / Self Care | Payer: BLUE CROSS/BLUE SHIELD | Source: Ambulatory Visit | Attending: Obstetrics and Gynecology | Admitting: Obstetrics and Gynecology

## 2015-08-15 DIAGNOSIS — N6310 Unspecified lump in the right breast, unspecified quadrant: Secondary | ICD-10-CM

## 2015-09-22 ENCOUNTER — Ambulatory Visit (INDEPENDENT_AMBULATORY_CARE_PROVIDER_SITE_OTHER): Payer: 59 | Admitting: Obstetrics and Gynecology

## 2015-09-22 ENCOUNTER — Encounter: Payer: Self-pay | Admitting: Obstetrics and Gynecology

## 2015-09-22 VITALS — BP 110/78 | HR 74 | Resp 16 | Ht 63.25 in | Wt 128.0 lb

## 2015-09-22 DIAGNOSIS — N63 Unspecified lump in breast: Secondary | ICD-10-CM | POA: Diagnosis not present

## 2015-09-22 DIAGNOSIS — Z8669 Personal history of other diseases of the nervous system and sense organs: Secondary | ICD-10-CM | POA: Diagnosis not present

## 2015-09-22 DIAGNOSIS — N6310 Unspecified lump in the right breast, unspecified quadrant: Secondary | ICD-10-CM

## 2015-09-22 MED ORDER — ELETRIPTAN HYDROBROMIDE 40 MG PO TABS: ORAL_TABLET | ORAL | Status: DC

## 2015-09-22 NOTE — Progress Notes (Signed)
GYNECOLOGY  VISIT   HPI: 45 y.o.   Married  Caucasian  female   [id number] with Patient's last menstrual period was 09/15/2015.   here for  6week f/u breast check 08/04/15 - routine exam showed Right breast with 1.5 cm mass at 1:00. Breast ultrasound showing a 1.2 cm complex cyst of the breast.   Wants Rx for Relpax instead of seeing her PCP. Has migraines related to her menses.  Uses them sporadically.  Can use them up to twice a week.  Has multiple triggers - stress.  No change in her migraines.   Recovering from surgery.  Increasing exercise.   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   GYNECOLOGIC HISTORY: Patient's last menstrual period was 09/15/2015. Contraception:husband vasectomy Menopausal hormone therapy: none Last mammogram: bilateral 07-18-15 category d density,birads 1:neg, 08-15-15 rt breast u/s 1 oclock cyst neg Last pap smear: 08-23-14 neg        OB History    Gravida Para Term Preterm AB TAB SAB Ectopic Multiple Living   3 1 1  2 1 1   1       Obstetric Comments   2 stepchildren         Patient Active Problem List   Diagnosis Date Noted  . Migraine 11/15/2014    Past Medical History  Diagnosis Date  . Allergy   . Asthma   . Migraine   . Cyst of right breast 2016    Past Surgical History  Procedure Laterality Date  . Cesarean section    . Cervical disc surgery  05/2015    --C6,C7--Dr. Lovell SheehanJenkins    Current Outpatient Prescriptions  Medication Sig Dispense Refill  . cetirizine (ZYRTEC) 10 MG tablet Take 10 mg by mouth daily.    Marland Kitchen. eletriptan (RELPAX) 40 MG tablet TAKE 1 TABLET BY MOUTH AS NEEDED AT ONSET OF MIGRAINE. 6 tablet 0  . fluticasone (FLONASE) 50 MCG/ACT nasal spray Place 2 sprays into both nostrils daily.    . Fluticasone-Salmeterol (ADVAIR DISKUS) 250-50 MCG/DOSE AEPB Inhale 1 puff into the lungs 2 (two) times daily. 1 each 3   No current facility-administered medications for this visit.     ALLERGIES: Molds  & smuts and Other  Family History  Problem Relation Age of Onset  . Cancer Paternal Grandfather     Liver CA?  Marland Kitchen. Cancer Maternal Aunt     breast CA    Social History   Social History  . Marital Status: Married    Spouse Name: N/A  . Number of Children: N/A  . Years of Education: N/A   Occupational History  . Not on file.   Social History Main Topics  . Smoking status: Never Smoker   . Smokeless tobacco: Never Used  . Alcohol Use: 0.6 - 1.8 oz/week    1-3 Standard drinks or equivalent per week     Comment: socially  . Drug Use: No  . Sexual Activity:    Partners: Male    Birth Control/ Protection: Other-see comments     Comment: Vasectomy   Other Topics Concern  . Not on file   Social History Narrative    ROS:  Pertinent items are noted in HPI.  PHYSICAL EXAMINATION:    BP 110/78 mmHg  Pulse 74  Resp 16  Ht 5' 3.25" (1.607 m)  Wt 128 lb (58.06 kg)  BMI 22.48 kg/m2  LMP 09/15/2015    General appearance: alert, cooperative and appears stated age  Breasts: Inspection negative, No nipple retraction or dimpling, No nipple discharge or bleeding, No axillary or supraclavicular adenopathy, Right breast with 1.5 cm smooth mass at 1:00.  left breast without masses, retractions, nipple discharge or axillary adenopathy.    Chaperone was present for exam.  ASSESSMENT  Right breast cyst.  Migraine headaches.   PLAN  Counseled regarding breast cysts.  Continue SBE and call if cyst increases in size. Screening mammogram due in October 2017.   Rx for Relpax #6 until patient can return to her PCP. #6, RF none.  Patient knows that I will ask her to return to her PCP for the Rx for future refills.  Annual exam in December 2017.  An After Visit Summary was printed and given to the patient.  _15_____ minutes face to face time of which over 50% was spent in counseling.   After visit summary to patient.

## 2016-01-02 ENCOUNTER — Ambulatory Visit (INDEPENDENT_AMBULATORY_CARE_PROVIDER_SITE_OTHER): Payer: 59 | Admitting: Internal Medicine

## 2016-01-02 VITALS — BP 116/74 | HR 74 | Temp 97.9°F | Resp 18 | Ht 63.0 in | Wt 132.0 lb

## 2016-01-02 DIAGNOSIS — G43109 Migraine with aura, not intractable, without status migrainosus: Secondary | ICD-10-CM | POA: Diagnosis not present

## 2016-01-02 MED ORDER — ELETRIPTAN HYDROBROMIDE 40 MG PO TABS: ORAL_TABLET | ORAL | Status: AC

## 2016-01-02 NOTE — Patient Instructions (Signed)
     IF you received an x-ray today, you will receive an invoice from [facility]. Please contact [facility] at [phone] with questions or concerns regarding your invoice.   IF you received labwork today, you will receive an invoice from [facility]. Please contact [facility] at [phone] with questions or concerns regarding your invoice.   Our billing staff will not be able to assist you with questions regarding bills from these companies.  You will be contacted with the lab results as soon as they are available. The fastest way to get your results is to activate your My Chart account. Instructions are located on the last page of this paperwork. If you have not heard from us regarding the results in 2 weeks, please contact this office.      

## 2016-01-02 NOTE — Progress Notes (Signed)
  By signing my name below I, [name], attest that this documentation has been prepared under the direction and in the presence of [name], MD. Electonically Signed. [name], Scribe 01/02/2016 at 4:39 PM  Subjective:    Patient ID: Jocelyn Hayes, female    DOB: 03/11/1971, 45 y.o.   MRN: [id number]  Chief Complaint  Patient presents with  . Follow-up    medication    HPI Jocelyn Hayes is a 45 y.o. female who presents to the [facility] for refill for her relpax which works well to control her migraines. Pt states she feels a sensation behind her rt eye prior to migraine and she takes her relpax right away. Pt states that her migraines are brought on by dehydration, stress, and always just prior to the start of her menstrual period.  Pt states she took hormonal birth control in the past and she did not like the emotional side effects.   Patient Active Problem List   Diagnosis Date Noted  . Migraine 11/15/2014    Current outpatient prescriptions:  .  cetirizine (ZYRTEC) 10 MG tablet, Take 10 mg by mouth daily., Disp: , Rfl:  .  eletriptan (RELPAX) 40 MG tablet, TAKE 1 TABLET BY MOUTH AS NEEDED AT ONSET OF MIGRAINE., Disp: 6 tablet, Rfl: 0 .  fluticasone (FLONASE) 50 MCG/ACT nasal spray, Place 2 sprays into both nostrils daily., Disp: , Rfl:  .  Fluticasone-Salmeterol (ADVAIR DISKUS) 250-50 MCG/DOSE AEPB, Inhale 1 puff into the lungs 2 (two) times daily., Disp: 1 each, Rfl: 3  Allergies  Allergen Reactions  . Molds & Smuts   . Other     Cats, grass, oak trees    Review of Systems Allergies stable--has meds --august is worst time    Objective:   Physical Exam  Constitutional: She is oriented to person, place, and time. She appears well-developed and well-nourished. No distress.  HENT:  Head: Normocephalic and atraumatic.  Eyes: Conjunctivae are normal. Pupils are equal, round, and reactive to light.  Neck: Neck supple.  Cardiovascular:  Normal rate.   Pulmonary/Chest: Effort normal.  Musculoskeletal: Normal range of motion.  Neurological: She is alert and oriented to person, place, and time.  Skin: Skin is warm and dry.  Psychiatric: She has a normal mood and affect. Her behavior is normal.  Nursing note and vitals reviewed.  Filed Vitals:   01/02/16 1611  BP: 116/74  Pulse: 74  Temp: 97.9 F (36.6 C)  TempSrc: Oral  Resp: 18  Height: 5\' 3"  (1.6 m)  Weight: 132 lb (59.875 kg)  SpO2: 99%          Assessment & Plan:  I have completed the patient encounter in its entirety as documented by the scribe, with editing by me where necessary. Lauris Keepers P. Merla Richesoolittle, M.D.  Migraine with aura and without status migrainosus, not intractable Meds ordered this encounter  Medications  . eletriptan (RELPAX) 40 MG tablet    Sig: TAKE 1 TABLET BY MOUTH AS NEEDED AT ONSET OF MIGRAINE.    Dispense:  10 tablet    Refill:  11   Tdap at f/u here or w/ Dr Edward JollySilva

## 2016-08-23 ENCOUNTER — Ambulatory Visit: Payer: BLUE CROSS/BLUE SHIELD | Admitting: Obstetrics and Gynecology

## 2016-12-16 IMAGING — MR MR CERVICAL SPINE W/O CM
4 of 5 series · 19 of 48 positions shown · non-contrast
Comparison: None.

CLINICAL DATA: Severe pain in left upper back, under left scapula
and down left arm with numbness in left hand x 2 weeks since
carrying heavy buckets. Patient also fell this morning on wet tile
floor. No priors, injections or surgery.

EXAM:
MRI CERVICAL SPINE WITHOUT CONTRAST
TECHNIQUE: Multiplanar, multisequence MR imaging of the cervical spine was
performed. No intravenous contrast was administered.

[Series 3: T2 · sagittal · 3.0mm · 0.39mm/px · 7 of 12 slices shown (1 of 2)]
[im 1/12]
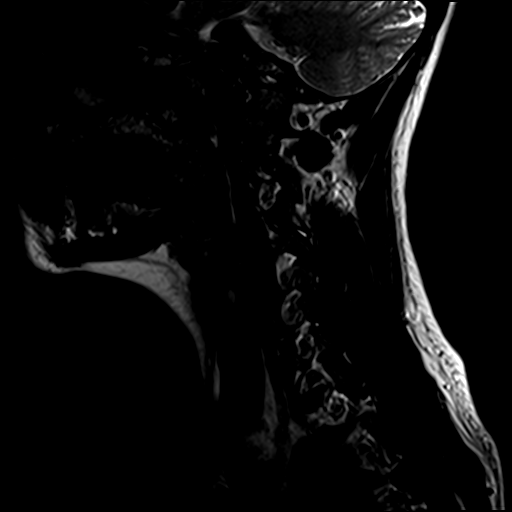
[im 2/12]
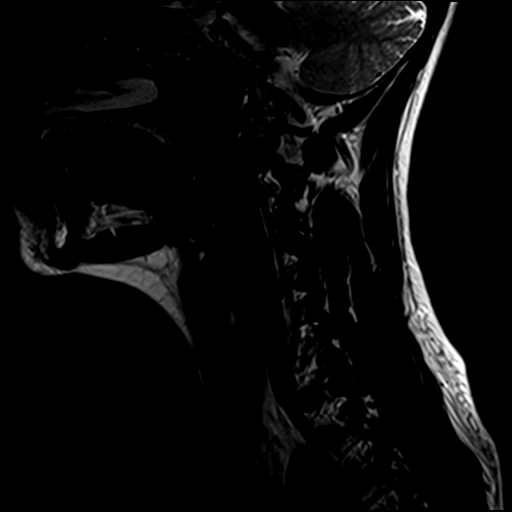
[im 4/12]
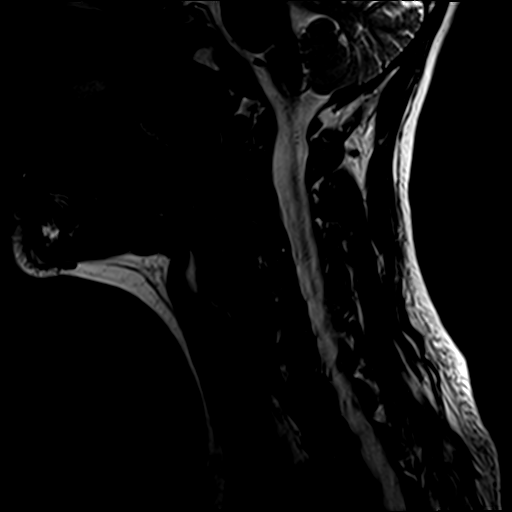
[im 6/12]
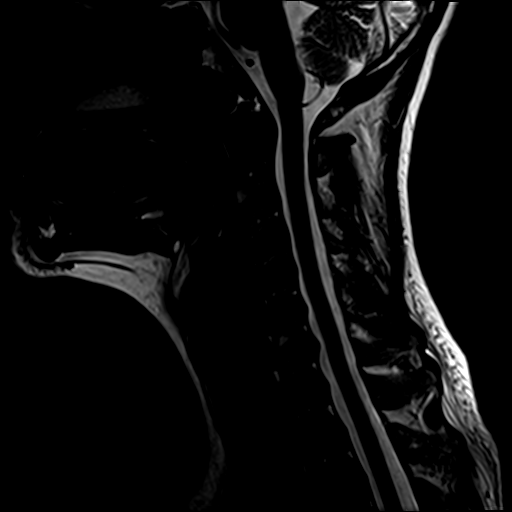
[im 8/12]
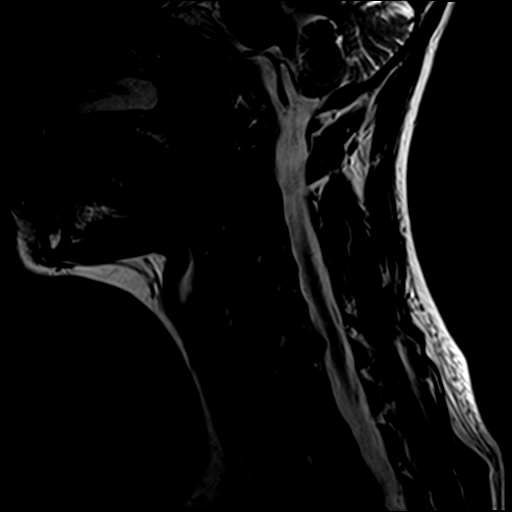
[im 10/12]
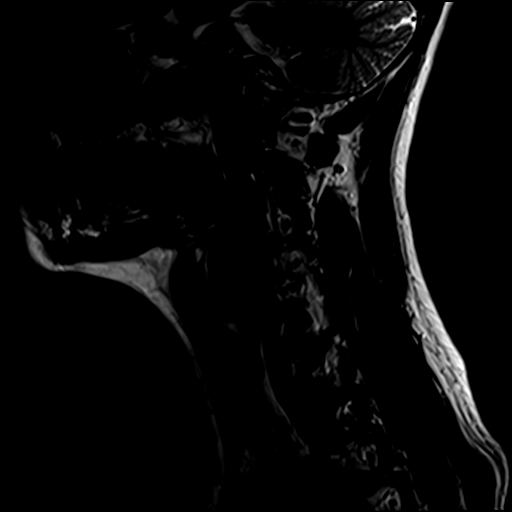
[im 12/12]
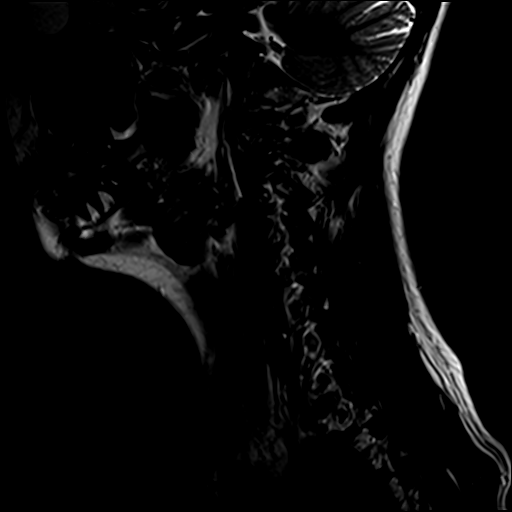

[Series 4: T1 · sagittal · 3.0mm · 0.39mm/px · 3 of 12 slices shown]
[im 2/12]
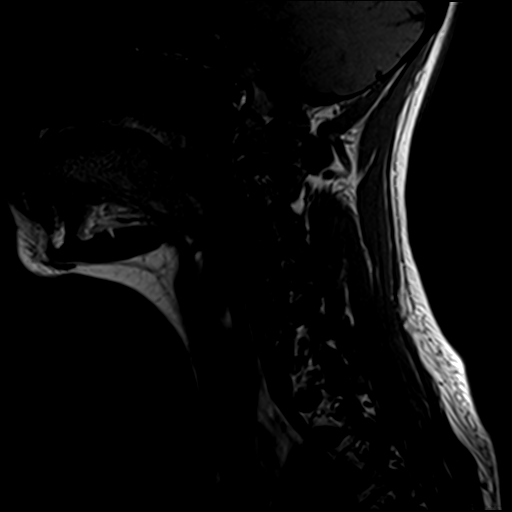
[im 6/12]
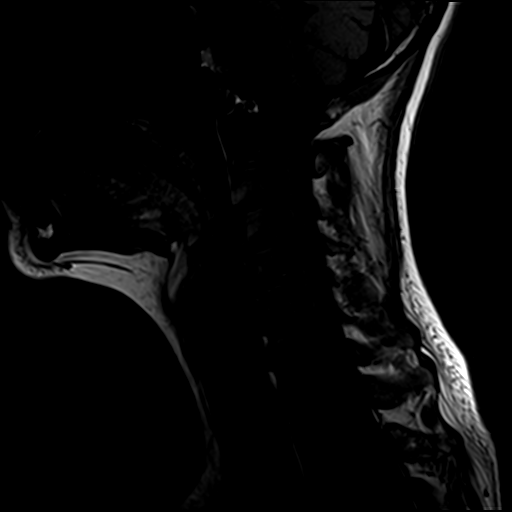
[im 10/12]
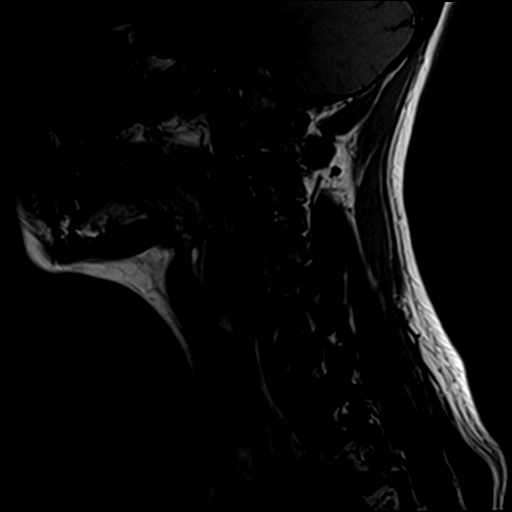

[Series 5: T2 · axial · 3.0mm · 0.33mm/px · z∈[-68,+14]mm · 6 of 27 slices shown (2 of 2)]
[im 1/27]
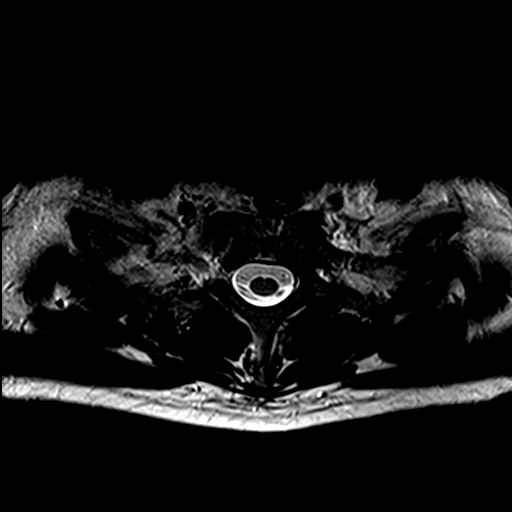
[im 5/27]
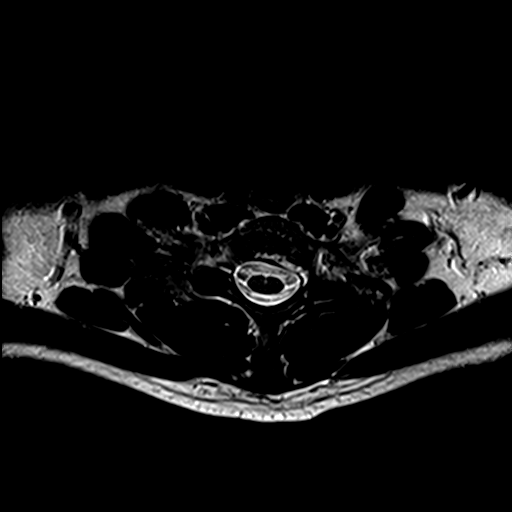
[im 9/27]
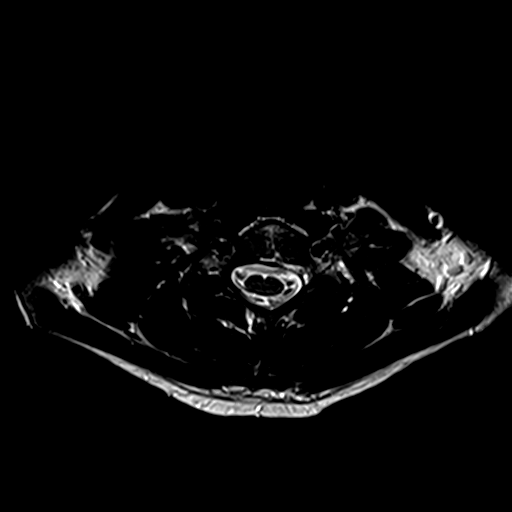
[im 13/27]
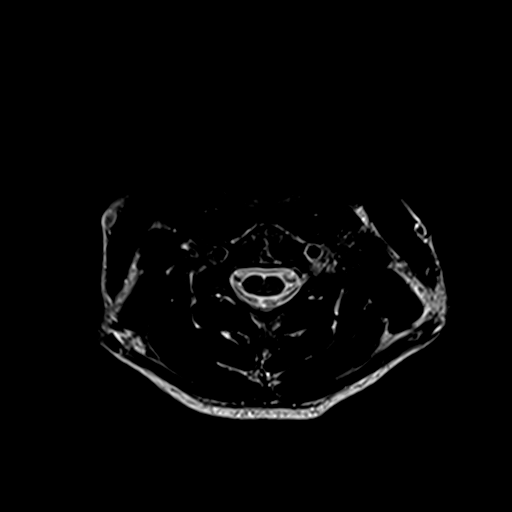
[im 15/27]
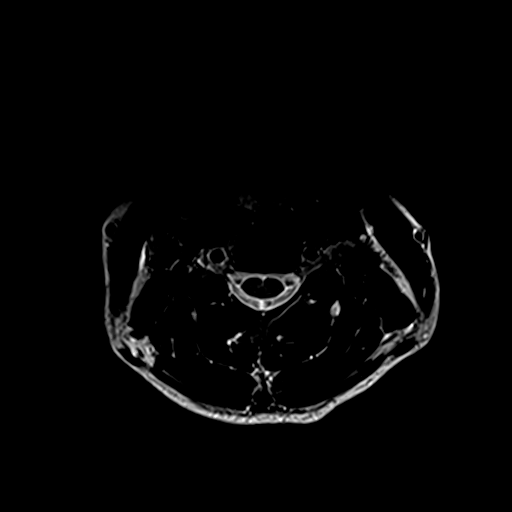
[im 23/27]
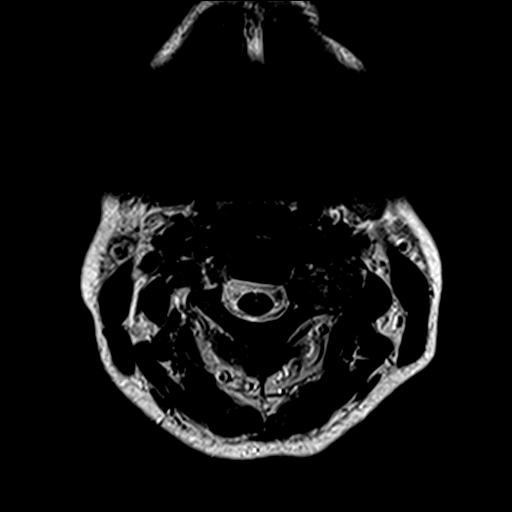

[Series 6: STIR · sagittal · 3.0mm · 0.39mm/px · 3 of 12 slices shown]
[im 3/12]
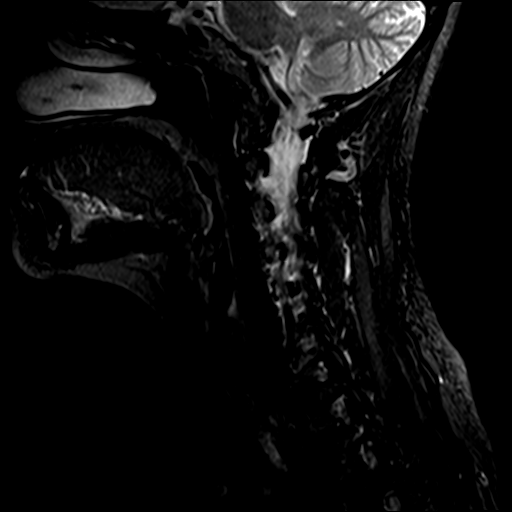
[im 7/12]
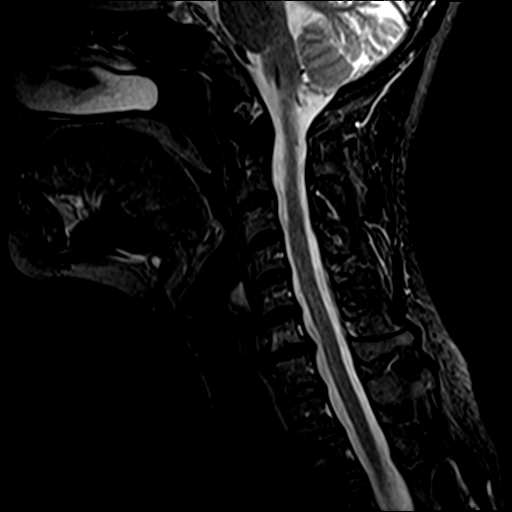
[im 12/12]
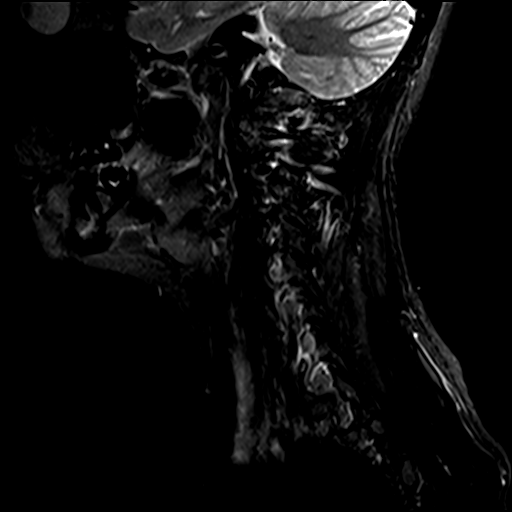

[19 of 48 positions shown; findings below may reference images not displayed]

FINDINGS: The cervical cord is normal in size and signal. Vertebral body
heights are maintained. There is degenerative disc disease with mild
disc height loss at C6-7. The cervical spine is normal in lordotic
alignment. No static listhesis. Bone marrow signal is normal.
Cerebellar tonsils are normal in position.

C2-3: No significant disc bulge. No neural foraminal stenosis. No
central canal stenosis.

C3-4: No significant disc bulge. No neural foraminal stenosis. No
central canal stenosis.

C4-5: No significant disc bulge. No neural foraminal stenosis. No
central canal stenosis.

C5-6: No significant disc bulge. No neural foraminal stenosis. No
central canal stenosis.

C6-7: Large left paracentral/foraminal disc protrusion mildly
effacing the left ventral paracentral CSF space. No neural foraminal
stenosis. No central canal stenosis.

C7-T1: No significant disc bulge. No neural foraminal stenosis. No
central canal stenosis.
IMPRESSION: 1. At C6-7 there is a large left paracentral/foraminal disc
protrusion mildly effacing the left ventral paracentral CSF space.
